# Patient Record
Sex: Female | Born: 1955 | Race: White | Hispanic: No | Marital: Single | Smoking: Never smoker
Health system: Southern US, Community
[De-identification: ages and names within clinical notes are randomized; demographics above are authoritative.]

## PROBLEM LIST (undated history)

## (undated) DIAGNOSIS — R112 Nausea with vomiting, unspecified: Secondary | ICD-10-CM

## (undated) DIAGNOSIS — K219 Gastro-esophageal reflux disease without esophagitis: Secondary | ICD-10-CM

## (undated) DIAGNOSIS — E119 Type 2 diabetes mellitus without complications: Secondary | ICD-10-CM

## (undated) DIAGNOSIS — Z9889 Other specified postprocedural states: Secondary | ICD-10-CM

## (undated) DIAGNOSIS — E781 Pure hyperglyceridemia: Secondary | ICD-10-CM

## (undated) DIAGNOSIS — M1990 Unspecified osteoarthritis, unspecified site: Secondary | ICD-10-CM

## (undated) HISTORY — DX: Pure hyperglyceridemia: E78.1

## (undated) HISTORY — DX: Type 2 diabetes mellitus without complications: E11.9

## (undated) HISTORY — PX: EYE SURGERY: SHX253

## (undated) HISTORY — PX: KNEE ARTHROPLASTY: SHX992

## (undated) HISTORY — PX: TRIGGER FINGER RELEASE: SHX641

## (undated) HISTORY — PX: OTHER SURGICAL HISTORY: SHX169

---

## 2000-01-02 ENCOUNTER — Encounter: Admission: RE | Admit: 2000-01-02 | Discharge: 2000-01-02 | Payer: Self-pay | Admitting: Obstetrics and Gynecology

## 2000-01-02 ENCOUNTER — Encounter: Payer: Self-pay | Admitting: Obstetrics and Gynecology

## 2001-10-27 ENCOUNTER — Ambulatory Visit (HOSPITAL_COMMUNITY): Admission: RE | Admit: 2001-10-27 | Discharge: 2001-10-28 | Payer: Self-pay | Admitting: Ophthalmology

## 2003-09-29 ENCOUNTER — Encounter: Admission: RE | Admit: 2003-09-29 | Discharge: 2003-09-29 | Payer: Self-pay | Admitting: Gynecology

## 2003-10-17 ENCOUNTER — Other Ambulatory Visit: Admission: RE | Admit: 2003-10-17 | Discharge: 2003-10-17 | Payer: Self-pay | Admitting: Gynecology

## 2004-11-14 ENCOUNTER — Other Ambulatory Visit: Admission: RE | Admit: 2004-11-14 | Discharge: 2004-11-14 | Payer: Self-pay | Admitting: Gynecology

## 2004-11-14 ENCOUNTER — Encounter: Admission: RE | Admit: 2004-11-14 | Discharge: 2004-11-14 | Payer: Self-pay | Admitting: Gynecology

## 2006-08-14 ENCOUNTER — Encounter: Admission: RE | Admit: 2006-08-14 | Discharge: 2006-08-14 | Payer: Self-pay | Admitting: Gynecology

## 2006-08-17 ENCOUNTER — Other Ambulatory Visit: Admission: RE | Admit: 2006-08-17 | Discharge: 2006-08-17 | Payer: Self-pay | Admitting: Gynecology

## 2007-08-17 ENCOUNTER — Encounter: Admission: RE | Admit: 2007-08-17 | Discharge: 2007-08-17 | Payer: Self-pay | Admitting: Gynecology

## 2008-09-28 ENCOUNTER — Encounter: Admission: RE | Admit: 2008-09-28 | Discharge: 2008-09-28 | Payer: Self-pay | Admitting: Gynecology

## 2008-11-09 ENCOUNTER — Ambulatory Visit (HOSPITAL_BASED_OUTPATIENT_CLINIC_OR_DEPARTMENT_OTHER): Admission: RE | Admit: 2008-11-09 | Discharge: 2008-11-09 | Payer: Self-pay | Admitting: Orthopedic Surgery

## 2009-11-09 ENCOUNTER — Encounter: Admission: RE | Admit: 2009-11-09 | Discharge: 2009-11-09 | Payer: Self-pay | Admitting: Gynecology

## 2010-09-16 LAB — BASIC METABOLIC PANEL
BUN: 12 mg/dL (ref 6–23)
Calcium: 9.3 mg/dL (ref 8.4–10.5)
Creatinine, Ser: 0.92 mg/dL (ref 0.4–1.2)
GFR calc non Af Amer: >60 mL/min (ref >60)
Glucose, Bld: 131 mg/dL — ABNORMAL HIGH (ref 70–99)
Potassium: 4.3 mEq/L (ref 3.5–5.1)

## 2010-09-16 LAB — POCT HEMOGLOBIN-HEMACUE: Hemoglobin: 14.6 g/dL (ref 12.0–15.0)

## 2010-10-15 ENCOUNTER — Other Ambulatory Visit: Payer: Self-pay | Admitting: Gynecology

## 2010-10-15 DIAGNOSIS — Z1231 Encounter for screening mammogram for malignant neoplasm of breast: Secondary | ICD-10-CM

## 2010-10-22 NOTE — Op Note (Signed)
Maria Malone, Maria Malone NO.:  [id number]   MEDICAL RECORD NO.:  [id number]          PATIENT TYPE:  AMB   LOCATION:  DSC                          FACILITY:  [facility]   PHYSICIAN:  Loreta Ave, M.D. DATE OF BIRTH:  [birth year]   DATE OF PROCEDURE:  11/09/2008  DATE OF DISCHARGE:                               OPERATIVE REPORT   PREOPERATIVE DIAGNOSES:  1. Left shoulder impingement.  2. Distal clavicle osteolysis.  3. Rotator cuff tendinopathy.   POSTOPERATIVE DIAGNOSES:  1. Left shoulder impingement.  2. Distal clavicle osteolysis.  3. Rotator cuff tendinopathy.   Now with a full-thickness tear, supraspinatus tendon in the anterior  aspect as well as secondary adhesive capsulitis.   PROCEDURE:  Left shoulder exam manipulation under anesthesia.  Arthroscopy, debridement rotator cuff.  Bursectomy, acromioplasty, and  CA ligament release.  Excision distal clavicle.  Arthroscopic-assisted  rotator cuff repair, fiber weave suture x1, swivel lock anchor x1.   SURGEON:  Loreta Ave, MD   ASSISTANT:  Genene Churn. Barry Dienes, [place], present throughout the entire case  necessary for timely completion of procedure.   ANESTHESIA:  General.   BLOOD LOSS:  Minimal.   SPECIMENS:  None.   CULTURES:  None.   COMPLICATIONS:  None.   DRESSING:  Soft compressive with shoulder immobilizer.   PROCEDURE:  The patient was brought to the operating room and placed on  the operative table in supine position.  After adequate anesthesia had  been obtained, placed in beach-chair position, prepped and draped in  usual sterile fashion.  Shoulder examined.  Lacking about 20% of motion  especially abduction and forward flexion.  Manipulated break up  adhesions achieving full motion stable shoulder without difficulty.  After being prepped and draped 3 portals created, one each anterior,  posterior, and lateral.  Shoulder entered with blunt obturator.  Arthroscope reduced.  Shoulder  distended and inspected.  Obvious  functional full-thickness tear, crescent region, supraspinatus tendon,  anterior half.  Debrided.  Visualized from below and above confirming  full-thickness tear.  Tuberosity roughened with bleeding bone.  Very  easily reparable, not retracted.  Subscapular infraspinatus, biceps  tendon, biceps anchor, and articular cartilage all otherwise looked  good.  From above almost a type 4 acromion.  Bursa resected.  Acromioplasty to a type 1 acromion releasing CA ligament.  Distal  clavicle grade 4 changes.  Periarticular spurs.  Periarticular spurs  lateral centimeter of clavicle resected with shaver high-speed bur.  Adequacy of decompression clavicle excision confirmed.  Cannula was  placed in the anterior and lateral portal.  The cuff was then captured  with a horizontal mattress suture with a scorpion device and fiber weave  suture.  This was brought out laterally and then firmly anchored down  into a punch hole in the tuberosity with a swivel lock anchor.  Nice  firm watertight closure of the cuff confirmed.  Instruments and fluid  removed.  Portals of shoulder and bursa injected with Marcaine.  Portals  were closed with 4-0 nylon.  A sterile compressive dressing applied.  A  shoulder immobilizer  applied.  Anesthesia reversed.  Brought to the  recovery room.  Tolerated surgery well.  No complications.      Loreta Ave, M.D.  Electronically Signed     DFM/MEDQ  D:  11/09/2008  T:  11/10/2008  Job:  [id number]

## 2010-10-25 NOTE — Op Note (Signed)
[place]. [facility]  Patient:    Maria Malone, Maria Malone Visit Number: [id number] MRN: [id number]          Service Type: DSU Location: [id number] Attending Physician:  Bertrum Sol Dictated by:   Beulah Gandy. Ashley Royalty, M.D. Proc. Date: 10/27/01 Admit Date:  10/27/2001 Discharge Date: 10/28/2001                             Operative Report  DATE OF BIRTH:  Jul 01, 1955  ADMISSION DIAGNOSES: 1. Retinal detachment with breaks, left eye. 2. Large retinal detachment with breaks, right eye.  PROCEDURES: 1. Laser photocoagulation, left eye. 2. Scleral buckle, right eye. 3. Laser photocoagulation, right eye.  SURGEON:  Beulah Gandy. Ashley Royalty, M.D.  ASSISTANT:   Winfred Burn, COA, SA.  ANESTHESIA:  General.  DESCRIPTION OF PROCEDURE:  Before prep and drape, the left eye was inspected and found to have multiple breaks.  These were surrounded with indirect ophthalmoscope laser; 548 burns were placed with a power of 400 mW, 1000 microns each, and 0.1 seconds each around the retinal breaks.  The largest break was at 12 oclock and had a small area of detachment around it. Attention was then carried to the right eye where usual prep and drape was performed.  A 360 limbal peritomy, isolation of four rectus muscles, 2-0 silk. Localization of breaks in the lower temporal quadrant.  Scleral dissection from 3 oclock to 9 oclock to admit a #279 intrascleral implant.  Diathermy placed in the bed.  Additional posterior dissection carried out in the lower temporal quadrant to accommodate a 508G radial piece.  The diathermy was placed in the scleral bed, and sutures were placed in the scleral flaps for a total of 5 scleral sutures.  The 279 implant was placed against the globe.  A 508G radial segment was created.  Perforation site chosen at 8 oclock in the posterior aspect of the bed.  A large amount of clear, colorless subretinal fluid came forth.  The buckle elements  were placed.  Scleral flaps were closed, knotted, and the free ends removed.  Indirect ophthalmoscopy showed the retina to by lying nicely in place on the scleral buckle with the breaks well supported.  The indirect ophthalmoscope laser was moved into place, and 412 burns were placed around the retinal breaks and on the scleral buckle. The power was 400 mW, 1000 microns each, and 0.1 seconds each.  A 240 band was placed around the eye with a 270 sleeve at 2 oclock and a belt loop at 1, 3, and 10 oclock.  The band was adjusted to a proper indentation on the globe and the ends were trimmed.  The closing tension was 15 with a Barraquer tonometer.  The conjunctiva was reposited with a 7-0 chromic suture. Polymyxin and gentamicin were irrigated in the tenon space. atropine solution was applied.  Decadron 10 mg was injected into the lower subconjunctival space.  Marcaine was injected around the globe for postop pain. Polysporin, a patch, and shield were placed.  The patient was awakened and taken to recovery in satisfactory condition.  Closing tension 15 mmHg. Complications: None.  Duration: 1-1/2 hours. Dictated by:   Beulah Gandy. Ashley Royalty, M.D. Attending Physician:  Bertrum Sol DD:  10/27/01 TD:  10/29/01 Job: [id number]

## 2010-11-11 ENCOUNTER — Ambulatory Visit
Admission: RE | Admit: 2010-11-11 | Discharge: 2010-11-11 | Disposition: A | Discharge status: Home / Self Care | Payer: PRIVATE HEALTH INSURANCE | Source: Ambulatory Visit | Attending: Gynecology | Admitting: Gynecology

## 2010-11-11 DIAGNOSIS — Z1231 Encounter for screening mammogram for malignant neoplasm of breast: Secondary | ICD-10-CM

## 2011-06-19 ENCOUNTER — Encounter (INDEPENDENT_AMBULATORY_CARE_PROVIDER_SITE_OTHER): Payer: PRIVATE HEALTH INSURANCE | Admitting: Ophthalmology

## 2011-07-02 ENCOUNTER — Ambulatory Visit (INDEPENDENT_AMBULATORY_CARE_PROVIDER_SITE_OTHER): Payer: BC Managed Care – PPO | Admitting: Ophthalmology

## 2011-07-02 DIAGNOSIS — H43819 Vitreous degeneration, unspecified eye: Secondary | ICD-10-CM

## 2011-07-02 DIAGNOSIS — H33309 Unspecified retinal break, unspecified eye: Secondary | ICD-10-CM

## 2011-07-02 DIAGNOSIS — E1139 Type 2 diabetes mellitus with other diabetic ophthalmic complication: Secondary | ICD-10-CM

## 2011-07-02 DIAGNOSIS — H33009 Unspecified retinal detachment with retinal break, unspecified eye: Secondary | ICD-10-CM

## 2011-07-02 DIAGNOSIS — E11319 Type 2 diabetes mellitus with unspecified diabetic retinopathy without macular edema: Secondary | ICD-10-CM

## 2011-07-02 DIAGNOSIS — H35419 Lattice degeneration of retina, unspecified eye: Secondary | ICD-10-CM

## 2011-09-12 ENCOUNTER — Other Ambulatory Visit: Payer: Self-pay | Admitting: Gynecology

## 2011-09-12 DIAGNOSIS — Z1231 Encounter for screening mammogram for malignant neoplasm of breast: Secondary | ICD-10-CM

## 2011-11-25 ENCOUNTER — Ambulatory Visit
Admission: RE | Admit: 2011-11-25 | Discharge: 2011-11-25 | Disposition: A | Discharge status: Home / Self Care | Payer: BC Managed Care – PPO | Source: Ambulatory Visit | Attending: Gynecology | Admitting: Gynecology

## 2011-11-25 DIAGNOSIS — Z1231 Encounter for screening mammogram for malignant neoplasm of breast: Secondary | ICD-10-CM

## 2011-12-30 ENCOUNTER — Other Ambulatory Visit: Payer: Self-pay | Admitting: Otolaryngology

## 2011-12-30 DIAGNOSIS — R220 Localized swelling, mass and lump, head: Secondary | ICD-10-CM

## 2011-12-30 DIAGNOSIS — R221 Localized swelling, mass and lump, neck: Secondary | ICD-10-CM

## 2011-12-31 ENCOUNTER — Ambulatory Visit
Admission: RE | Admit: 2011-12-31 | Discharge: 2011-12-31 | Disposition: A | Discharge status: Home / Self Care | Payer: BC Managed Care – PPO | Source: Ambulatory Visit | Attending: Otolaryngology | Admitting: Otolaryngology

## 2011-12-31 DIAGNOSIS — R220 Localized swelling, mass and lump, head: Secondary | ICD-10-CM

## 2011-12-31 MED ORDER — IOHEXOL 300 MG/ML  SOLN
75.0000 mL | Freq: Once | INTRAMUSCULAR | Status: AC | PRN
  Administered 2011-12-31: 75 mL via INTRAVENOUS

## 2012-01-12 ENCOUNTER — Other Ambulatory Visit: Payer: Self-pay | Admitting: Otolaryngology

## 2012-02-17 ENCOUNTER — Ambulatory Visit (INDEPENDENT_AMBULATORY_CARE_PROVIDER_SITE_OTHER): Payer: BC Managed Care – PPO | Admitting: Emergency Medicine

## 2012-02-17 VITALS — BP 117/73 | HR 82 | Temp 98.3°F | Resp 16

## 2012-02-17 DIAGNOSIS — L508 Other urticaria: Secondary | ICD-10-CM

## 2012-02-17 MED ORDER — DIPHENHYDRAMINE HCL 25 MG PO CAPS
50.0000 mg | ORAL_CAPSULE | Freq: Once | ORAL | Status: AC
  Administered 2012-02-17: 50 mg via ORAL

## 2012-02-17 MED ORDER — METHYLPREDNISOLONE ACETATE 80 MG/ML IJ SUSP
120.0000 mg | Freq: Once | INTRAMUSCULAR | Status: AC
  Administered 2012-02-17: 120 mg via INTRAMUSCULAR

## 2012-02-17 NOTE — Progress Notes (Addendum)
   Date:  02/17/2012   Name:  Maria Malone   DOB:  May 07, 1956   MRN:  [id number] Gender: female Age: 56 y.o.  PCP:  No primary provider on file.    Chief Complaint: Allergic Reaction   History of Present Illness:  Maria Malone is a 56 y.o. pleasant patient who presents with the following:  Took ibuprofen at 2:30-3:00 and developed urticaria and swelling of the face.  No hoarseness, difficulty swallowing or talking, no wheezing or shortness of breath.  Has been under a dermatologist's care since the spring of 13 for evaluation and treatment of hives.  Takes allegra (unkown dose) twice daily for treatment of her urticaria.  She has no new allergen exposure and no other complaints.    There is no problem list on file for this patient.   No past medical history on file.  No past surgical history on file.  History  Substance Use Topics  . Smoking status: Not on file  . Smokeless tobacco: Not on file  . Alcohol Use: Not on file    No family history on file.  No Known Allergies  Medication list has been reviewed and updated.  Current Outpatient Prescriptions on File Prior to Visit  Medication Sig Dispense Refill  . metFORMIN (GLUCOPHAGE) 1000 MG tablet Take 1,000 mg by mouth 2 (two) times daily with a meal.      . omeprazole (PRILOSEC) 20 MG capsule Take 20 mg by mouth 2 (two) times daily.      . rosuvastatin (CRESTOR) 10 MG tablet Take 10 mg by mouth daily.        Review of Systems:  As per HPI, otherwise negative.    Physical Examination: Filed Vitals:   02/17/12 1919  BP: 117/73  Pulse: 82  Temp: 98.3 F (36.8 C)  Resp: 16   There were no vitals filed for this visit. There is no height or weight on file to calculate BMI. Ideal Body Weight:    GEN: WDWN, NAD, Non-toxic, A & O x 3  Urticaria on left upper arm.   HEENT: Atraumatic, Normocephalic. Neck supple. No masses, No LAD.  Swelling upper lip and cheeks.  Oropharynx negative Ears and Nose: No external  deformity.  TM negative CV: RRR, No M/G/R. No JVD. No thrill. No extra heart sounds. PULM: CTA B, no wheezes, crackles, rhonchi. No retractions. No resp. distress. No accessory muscle use. ABD: S, NT, ND, +BS. No rebound. No HSM. EXTR: No c/c/e NEURO Normal gait.  PSYCH: Normally interactive. Conversant. Not depressed or anxious appearing.  Calm demeanor.    Assessment and Plan: Allergic reaction presumably to ibuprofen Depo medrol Benadryl Avoid NSAIDs in future Follow up as needed  Carmelina Dane, MD I have reviewed and agree with documentation. Robert P. Merla Riches, M.D.

## 2012-02-17 NOTE — Patient Instructions (Addendum)
Hives Hives (urticaria) are itchy, red, swollen patches on the skin. They may change size, shape, and location quickly and repeatedly. Hives that occur deeper in the skin can cause swelling of the hands, feet, and face. Hives may be an allergic reaction to something you or your child ate, touched, or put on the skin. Hives can also be a reaction to cold, heat, viral infections, medication, insect bites, or emotional stress. Often the cause is hard to find. Hives can come and go for several days to several weeks. Hives are not contagious. HOME CARE INSTRUCTIONS   If the cause of the hives is known, avoid exposure to that source.   To relieve itching and rash:   Apply cold compresses to the skin or take cool water baths. Do not take or give your child hot baths or showers because the warmth will make the itching worse.   The best medicine for hives is an antihistamine. An antihistamine will not cure hives, but it will reduce their severity. You can use an antihistamine available over the counter. This medicine may make your child sleepy. Teenagers should not drive while using this medicine.   Take or give an antihistamine every 6 hours until the hives are completely gone for 24 hours or as directed.   Your child may have other medications prescribed for itching. Give these as directed by your child's caregiver.   You or your child should wear loose fitting clothing, including undergarments. Skin irritations may make hives worse.   Follow-up as directed by your caregiver.  SEEK MEDICAL CARE IF:   You or your child still have considerable itching after taking the medication (prescribed or purchased over the counter).   Joint swelling or pain occurs.  SEEK IMMEDIATE MEDICAL CARE IF:   You have a fever.   Swollen lips or tongue are noticed.   There is difficulty with breathing, swallowing, or tightness in the throat or chest.   Abdominal pain develops.   Your child starts acting very  sick.  These may be the first signs of a life-threatening allergic reaction. THIS IS AN EMERGENCY. Call 911 for medical help. MAKE SURE YOU:   Understand these instructions.   Will watch your condition.   Will get help right away if you are not doing well or get worse.  Document Released: 05/26/2005 Document Revised: 05/15/2011 Document Reviewed: 01/14/2008 Exit[employer] Patient Information 2012 [employer]. 

## 2012-07-02 ENCOUNTER — Ambulatory Visit (INDEPENDENT_AMBULATORY_CARE_PROVIDER_SITE_OTHER): Payer: BC Managed Care – PPO | Admitting: Ophthalmology

## 2012-07-02 DIAGNOSIS — E11319 Type 2 diabetes mellitus with unspecified diabetic retinopathy without macular edema: Secondary | ICD-10-CM

## 2012-07-02 DIAGNOSIS — H2510 Age-related nuclear cataract, unspecified eye: Secondary | ICD-10-CM

## 2012-07-02 DIAGNOSIS — H33009 Unspecified retinal detachment with retinal break, unspecified eye: Secondary | ICD-10-CM

## 2012-07-02 DIAGNOSIS — H33309 Unspecified retinal break, unspecified eye: Secondary | ICD-10-CM

## 2012-07-02 DIAGNOSIS — E1139 Type 2 diabetes mellitus with other diabetic ophthalmic complication: Secondary | ICD-10-CM

## 2012-07-02 DIAGNOSIS — E1165 Type 2 diabetes mellitus with hyperglycemia: Secondary | ICD-10-CM

## 2012-07-02 DIAGNOSIS — H43819 Vitreous degeneration, unspecified eye: Secondary | ICD-10-CM

## 2012-10-21 ENCOUNTER — Other Ambulatory Visit: Payer: Self-pay

## 2012-10-21 DIAGNOSIS — Z1231 Encounter for screening mammogram for malignant neoplasm of breast: Secondary | ICD-10-CM

## 2012-11-30 ENCOUNTER — Ambulatory Visit
Admission: RE | Admit: 2012-11-30 | Discharge: 2012-11-30 | Disposition: A | Discharge status: Home / Self Care | Payer: BC Managed Care – PPO | Source: Ambulatory Visit

## 2012-11-30 DIAGNOSIS — Z1231 Encounter for screening mammogram for malignant neoplasm of breast: Secondary | ICD-10-CM

## 2013-01-17 ENCOUNTER — Encounter: Payer: Self-pay | Admitting: Gynecology

## 2013-01-17 ENCOUNTER — Other Ambulatory Visit (HOSPITAL_COMMUNITY)
Admission: RE | Admit: 2013-01-17 | Discharge: 2013-01-17 | Disposition: A | Discharge status: Home / Self Care | Payer: BC Managed Care – PPO | Source: Ambulatory Visit | Attending: Gynecology | Admitting: Gynecology

## 2013-01-17 ENCOUNTER — Ambulatory Visit (INDEPENDENT_AMBULATORY_CARE_PROVIDER_SITE_OTHER): Payer: BC Managed Care – PPO | Admitting: Gynecology

## 2013-01-17 VITALS — BP 112/72 | Ht 65.5 in | Wt 181.0 lb

## 2013-01-17 DIAGNOSIS — Z1151 Encounter for screening for human papillomavirus (HPV): Secondary | ICD-10-CM | POA: Insufficient documentation

## 2013-01-17 DIAGNOSIS — Z79890 Hormone replacement therapy (postmenopausal): Secondary | ICD-10-CM

## 2013-01-17 DIAGNOSIS — N952 Postmenopausal atrophic vaginitis: Secondary | ICD-10-CM

## 2013-01-17 DIAGNOSIS — E119 Type 2 diabetes mellitus without complications: Secondary | ICD-10-CM | POA: Insufficient documentation

## 2013-01-17 DIAGNOSIS — E785 Hyperlipidemia, unspecified: Secondary | ICD-10-CM | POA: Insufficient documentation

## 2013-01-17 DIAGNOSIS — Z01419 Encounter for gynecological examination (general) (routine) without abnormal findings: Secondary | ICD-10-CM

## 2013-01-17 DIAGNOSIS — Z8639 Personal history of other endocrine, nutritional and metabolic disease: Secondary | ICD-10-CM

## 2013-01-17 MED ORDER — ESTRADIOL 0.1 MG/GM VA CREA: TOPICAL_CREAM | VAGINAL | Status: DC

## 2013-01-17 NOTE — Progress Notes (Signed)
Maria Malone [birth year] [id number]   History:    57 y.o.  for annual gyn exam who is a new patient to the practice. The patient without any complaints. Patient was previously been followed by Dr. Nicholas Lose who has retired and we do not have her past records to review. Patient is being followed by Dr. Casimiro Needle Altheimer for her type 2 diabetes and hyperlipidemia and has been doing her lab work. Patient denies any past history of abnormal Pap smears. Her mammogram June of this year was normal. Dr. Susy Frizzle off had done her colonoscopy in 2012. Patient's father with history of colon polyp and for this reason patient is on a five-year recall. Patient's sister had breast cancer at the age of 68 and patient not sure if she was tested for BRCA1 and BRCA2 gene mutation. Patient's only hormone replacement therapy has been Premarin vaginal cream twice a week for vaginal atrophy. Patient stated over 2 years ago she had a normal bone density study. She has had history of vitamin D deficiency in the past. She is currently taking calcium and vitamin D a regular basis and is active exercising regularly. She is a Estate agent.  Past medical history,surgical history, family history and social history were all reviewed and documented in the EPIC chart.  Gynecologic History No LMP recorded. Patient is postmenopausal. Contraception: post menopausal status Last Pap: 2013. Results were: normal Last mammogram: 2014. Results were: normal  Obstetric History OB History   Grav Para Term Preterm Abortions TAB SAB Ect Mult Living   0                ROS: A ROS was performed and pertinent positives and negatives are included in the history.  GENERAL: No fevers or chills. HEENT: No change in vision, no earache, sore throat or sinus congestion. NECK: No pain or stiffness. CARDIOVASCULAR: No chest pain or pressure. No palpitations. PULMONARY: No shortness of breath, cough or wheeze. GASTROINTESTINAL: No abdominal  pain, nausea, vomiting or diarrhea, melena or bright red blood per rectum. GENITOURINARY: No urinary frequency, urgency, hesitancy or dysuria. MUSCULOSKELETAL: No joint or muscle pain, no back pain, no recent trauma. DERMATOLOGIC: No rash, no itching, no lesions. ENDOCRINE: No polyuria, polydipsia, no heat or cold intolerance. No recent change in weight. HEMATOLOGICAL: No anemia or easy bruising or bleeding. NEUROLOGIC: No headache, seizures, numbness, tingling or weakness. PSYCHIATRIC: No depression, no loss of interest in normal activity or change in sleep pattern.     Exam: chaperone present  BP 112/72  Ht 5' 5.5" (1.664 m)  Wt 181 lb (82.101 kg)  BMI 29.65 kg/m2  Body mass index is 29.65 kg/(m^2).  General appearance : Well developed well nourished female. No acute distress HEENT: Neck supple, trachea midline, no carotid bruits, no thyroidmegaly Lungs: Clear to auscultation, no rhonchi or wheezes, or rib retractions  Heart: Regular rate and rhythm, no murmurs or gallops Breast:Examined in sitting and supine position were symmetrical in appearance, no palpable masses or tenderness,  no skin retraction, no nipple inversion, no nipple discharge, no skin discoloration, no axillary or supraclavicular lymphadenopathy Abdomen: no palpable masses or tenderness, no rebound or guarding Extremities: no edema or skin discoloration or tenderness  Pelvic:  Bartholin, Urethra, Skene Glands: Within normal limits             Vagina: No gross lesions or discharge  Cervix: No gross lesions or discharge  Uterus  anteverted, normal size, shape and consistency, non-tender and mobile  Adnexa  Without masses or tenderness  Anus and perineum  normal   Rectovaginal  normal sphincter tone without palpated masses or tenderness             Hemoccult card provided     Assessment/Plan:  57 y.o. female for annual exam with history of type 2 diabetes and hyperlipidemia being followed by her endocrinologist. No  lab work done today. Pap smear was done today. The new guidelines were discussed. Patient was reminded to do monthly self breast examination. She will schedule a bone density study here in the office in a few weeks. She was provided with Hemoccult card to submit to the office for testing. We once again requests records from Dr. Nicholas Lose.    Ok Edwards MD, 11:35 AM 01/17/2013

## 2013-01-17 NOTE — Addendum Note (Signed)
Addended by: Richardson Chiquito on: 01/17/2013 11:49 AM   Modules accepted: Orders

## 2013-01-17 NOTE — Patient Instructions (Addendum)
Shingles Vaccine What You Need to Know WHAT IS SHINGLES?  Shingles is a painful skin rash, often with blisters. It is also called Herpes Zoster or just Zoster.  A shingles rash usually appears on one side of the face or body and lasts from 2 to 4 weeks. Its main symptom is pain, which can be quite severe. Other symptoms of shingles can include fever, headache, chills, and upset stomach. Very rarely, a shingles infection can lead to pneumonia, hearing problems, blindness, brain inflammation (encephalitis), or death.  For about 1 person in 5, severe pain can continue even after the rash clears up. This is called post-herpetic neuralgia.  Shingles is caused by the Varicella Zoster virus. This is the same virus that causes chickenpox. Only someone who has had a case of chickenpox or rarely, has gotten chickenpox vaccine, can get shingles. The virus stays in your body. It can reappear many years later to cause a case of shingles.  You cannot catch shingles from another person with shingles. However, a person who has never had chickenpox (or chickenpox vaccine) could get chickenpox from someone with shingles. This is not very common.  Shingles is far more common in people 50 and older than in younger people. It is also more common in people whose immune systems are weakened because of a disease such as cancer or drugs such as steroids or chemotherapy.  At least 1 million people get shingles per year in the [place]. SHINGLES VACCINE  A vaccine for shingles was licensed in 2006. In clinical trials, the vaccine reduced the risk of shingles by 50%. It can also reduce the pain in people who still get shingles after being vaccinated.  A single dose of shingles vaccine is recommended for adults 60 years of age and older. SOME PEOPLE SHOULD NOT GET SHINGLES VACCINE OR SHOULD WAIT A person should not get shingles vaccine if he or she:  Has ever had a life-threatening allergic reaction to gelatin, the  antibiotic neomycin, or any other component of shingles vaccine. Tell your caregiver if you have any severe allergies.  Has a weakened immune system because of current:  AIDS or another disease that affects the immune system.  Treatment with drugs that affect the immune system, such as prolonged use of high-dose steroids.  Cancer treatment, such as radiation or chemotherapy.  Cancer affecting the bone marrow or lymphatic system, such as leukemia or lymphoma.  Is pregnant, or might be pregnant. Women should not become pregnant until at least 4 weeks after getting shingles vaccine. Someone with a minor illness, such as a cold, may be vaccinated. Anyone with a moderate or severe acute illness should usually wait until he or she recovers before getting the vaccine. This includes anyone with a temperature of 101.3 F (38 C) or higher. WHAT ARE THE RISKS FROM SHINGLES VACCINE?  A vaccine, like any medicine, could possibly cause serious problems, such as severe allergic reactions. However, the risk of a vaccine causing serious harm, or death, is extremely small.  No serious problems have been identified with shingles vaccine. Mild Problems  Redness, soreness, swelling, or itching at the site of the injection (about 1 person in 3).  Headache (about 1 person in 70). Like all vaccines, shingles vaccine is being closely monitored for unusual or severe problems. WHAT IF THERE IS A MODERATE OR SEVERE REACTION? What should I look for? Any unusual condition, such as a severe allergic reaction or a high fever. If a severe allergic reaction   occurred, it would be within a few minutes to an hour after the shot. Signs of a serious allergic reaction can include difficulty breathing, weakness, hoarseness or wheezing, a fast heartbeat, hives, dizziness, paleness, or swelling of the throat. What should I do?  Call your caregiver, or get the person to a caregiver right away.  Tell the caregiver what  happened, the date and time it happened, and when the vaccination was given.  Ask the caregiver to report the reaction by filing a Vaccine Adverse Event Reporting System (VAERS) form. Or, you can file this report through the VAERS web site at www.vaers.LAgents.no or by calling 1-985-232-5186. VAERS does not provide medical advice. HOW CAN I LEARN MORE?  Ask your caregiver. He or she can give you the vaccine package insert or suggest other sources of information.  Contact the Centers for Disease Control and Prevention (CDC):  Call 614-087-8413 (1-800-CDC-INFO).  Visit the CDC website at PicCapture.uy CDC Shingles Vaccine VIS (03/14/08) Document Released: 03/23/2006 Document Revised: 08/18/2011 Document Reviewed: 03/14/2008 ExitCare Patient Information 2014 [place], [place]. Tetanus, Diphtheria, Pertussis (Tdap) Vaccine What You Need to Know WHY GET VACCINATED? Tetanus, diphtheria and pertussis can be very serious diseases, even for adolescents and adults. Tdap vaccine can protect [place] from these diseases. TETANUS (Lockjaw) causes painful muscle tightening and stiffness, usually all over the body.  It can lead to tightening of muscles in the head and neck so you can't open your mouth, swallow, or sometimes even breathe. Tetanus kills about 1 out of 5 people who are infected. DIPHTHERIA can cause a thick coating to form in the back of the throat.  It can lead to breathing problems, paralysis, heart failure, and death. PERTUSSIS (Whooping Cough) causes severe coughing spells, which can cause difficulty breathing, vomiting and disturbed sleep.  It can also lead to weight loss, incontinence, and rib fractures. Up to 2 in 100 adolescents and 5 in 100 adults with pertussis are hospitalized or have complications, which could include pneumonia and death. These diseases are caused by bacteria. Diphtheria and pertussis are spread from person to person through coughing or sneezing. Tetanus enters  the body through cuts, scratches, or wounds. Before vaccines, the [place] saw as many as 200,000 cases a year of diphtheria and pertussis, and hundreds of cases of tetanus. Since vaccination began, tetanus and diphtheria have dropped by about 99% and pertussis by about 80%. TDAP VACCINE Tdap vaccine can protect adolescents and adults from tetanus, diphtheria, and pertussis. One dose of Tdap is routinely given at age 96 or 62. People who did not get Tdap at that age should get it as soon as possible. Tdap is especially important for health care professionals and anyone having close contact with a baby younger than 12 months. Pregnant women should get a dose of Tdap during every pregnancy, to protect the newborn from pertussis. Infants are most at risk for severe, life-threatening complications from pertussis. A similar vaccine, called Td, protects from tetanus and diphtheria, but not pertussis. A Td booster should be given every 10 years. Tdap may be given as one of these boosters if you have not already gotten a dose. Tdap may also be given after a severe cut or burn to prevent tetanus infection. Your doctor can give you more information. Tdap may safely be given at the same time as other vaccines. SOME PEOPLE SHOULD NOT GET THIS VACCINE  If you ever had a life-threatening allergic reaction after a dose of any tetanus, diphtheria, or pertussis containing  vaccine, OR if you have a severe allergy to any part of this vaccine, you should not get Tdap. Tell your doctor if you have any severe allergies.  If you had a coma, or long or multiple seizures within 7 days after a childhood dose of DTP or DTaP, you should not get Tdap, unless a cause other than the vaccine was found. You can still get Td.  Talk to your doctor if you:  have epilepsy or another nervous system problem,  had severe pain or swelling after any vaccine containing diphtheria, tetanus or pertussis,  ever had Guillain-Barr  Syndrome (GBS),  aren't feeling well on the day the shot is scheduled. RISKS OF A VACCINE REACTION With any medicine, including vaccines, there is a chance of side effects. These are usually mild and go away on their own, but serious reactions are also possible. Brief fainting spells can follow a vaccination, leading to injuries from falling. Sitting or lying down for about 15 minutes can help prevent these. Tell your doctor if you feel dizzy or light-headed, or have vision changes or ringing in the ears. Mild problems following Tdap (Did not interfere with activities)  Pain where the shot was given (about 3 in 4 adolescents or 2 in 3 adults)  Redness or swelling where the shot was given (about 1 person in 5)  Mild fever of at least 100.68F (up to about 1 in 25 adolescents or 1 in 100 adults)  Headache (about 3 or 4 people in 10)  Tiredness (about 1 person in 3 or 4)  Nausea, vomiting, diarrhea, stomach ache (up to 1 in 4 adolescents or 1 in 10 adults)  Chills, body aches, sore joints, rash, swollen glands (uncommon) Moderate problems following Tdap (Interfered with activities, but did not require medical attention)  Pain where the shot was given (about 1 in 5 adolescents or 1 in 100 adults)  Redness or swelling where the shot was given (up to about 1 in 16 adolescents or 1 in 25 adults)  Fever over 102F (about 1 in 100 adolescents or 1 in 250 adults)  Headache (about 3 in 20 adolescents or 1 in 10 adults)  Nausea, vomiting, diarrhea, stomach ache (up to 1 or 3 people in 100)  Swelling of the entire arm where the shot was given (up to about 3 in 100). Severe problems following Tdap (Unable to perform usual activities, required medical attention)  Swelling, severe pain, bleeding and redness in the arm where the shot was given (rare). A severe allergic reaction could occur after any vaccine (estimated less than 1 in a million doses). WHAT IF THERE IS A SERIOUS REACTION? What  should I look for?  Look for anything that concerns you, such as signs of a severe allergic reaction, very high fever, or behavior changes. Signs of a severe allergic reaction can include hives, swelling of the face and throat, difficulty breathing, a fast heartbeat, dizziness, and weakness. These would start a few minutes to a few hours after the vaccination. What should I do?  If you think it is a severe allergic reaction or other emergency that can't wait, call 9-1-1 or get the person to the nearest hospital. Otherwise, call your doctor.  Afterward, the reaction should be reported to the "Vaccine Adverse Event Reporting System" (VAERS). Your doctor might file this report, or you can do it yourself through the VAERS web site at www.vaers.SamedayNews.es, or by calling 913-238-6089. VAERS is only for reporting reactions. They do not give  medical advice.  THE NATIONAL VACCINE INJURY COMPENSATION PROGRAM The National Vaccine Injury Compensation Program (VICP) is a federal program that was created to compensate people who may have been injured by certain vaccines. Persons who believe they may have been injured by a vaccine can learn about the program and about filing a claim by calling 1-623-589-0911 or visiting the VICP website at SpiritualWord.at. HOW CAN I LEARN MORE?  Ask your doctor.  Call your local or state health department.  Contact the Centers for Disease Control and Prevention (CDC):  Call 579-114-1099 or visit CDC's website at PicCapture.uy. CDC Tdap Vaccine VIS (10/16/11) Document Released: 11/25/2011 Document Revised: 02/18/2012 Document Reviewed: 11/25/2011 ExitCare Patient Information 2014 [place], [place]. BRCA-1 and BRCA-2 BRCA-1 and BRCA-2 are 2 genes that are linked with hereditary breast and ovarian cancers. About 200,000 women are diagnosed with invasive breast cancer each year and about 23,000 with ovarian cancer (according to the American Cancer  Society). Of these cancers, about 5% to 10% will be due to a mutation in one of the BRCA genes. Men can also inherit an increased risk of developing breast cancer, primarily from an alteration in the BRCA-2 gene.  Individuals with mutations in BRCA1 or BRCA2 have significantly elevated risks for breast cancer (up to 80% lifetime risk), ovarian cancer (up to 40% lifetime risk), bilateral breast cancer and other types of cancers. BRCA mutations are inherited and passed from generation to generation. One half of the time, they are passed from the father's side of the family.  The DNA in white blood cells is used to detect mutations in the BRCA genes. While the gene products (proteins) of the BRCA genes act only in breast and ovarian tissue, the genes are present in every cell of the body and blood is the most easily accessible source of that DNA. PREPARATION FOR TEST The test for BRCA mutations is done on a blood sample collected by needle from a vein in the arm. The test does not require surgical biopsy of breast or ovarian tissue.  NORMAL FINDINGS No genetic mutations. Ranges for normal findings may vary among different laboratories and hospitals. You should always check with your doctor after having lab work or other tests done to discuss the meaning of your test results and whether your values are considered within normal limits. MEANING OF TEST  Your caregiver will go over the test results with you and discuss the importance and meaning of your results, as well as treatment options and the need for additional tests if necessary. OBTAINING THE TEST RESULTS It is your responsibility to obtain your test results. Ask the lab or department performing the test when and how you will get your results. OTHER THINGS TO KNOW Your test results may have implications for other family members. When one member of a family is tested for BRCA mutations, issues often arise about how or whether to share this information  with other family members. Seek advice from a genetic counselor about communication of result with your family members.  Pre and post test consultation with a health care provider knowledgeable about genetic testing cannot be overemphasized.  There are many issues to be considered when preparing for a genetic test and upon learning the results, and a genetic counselor has the knowledge and experience to help you sort through them.  If the BRCA test is positive, the options include increased frequency of check-ups (e.g., mammography, blood tests for CA-125, or transvaginal ultrasonography); medications that could reduce risk (e.g., oral  contraceptives or tamoxifen); or surgical removal of the ovaries or breasts. There are a number of variables involved and it is important to discuss your options with your doctor and genetic counselor. Research studies have reported that for every 1000 women negative for BRCA mutations, between 12 and 45 of them will develop breast cancer by age 57 and between 3 and 4 will develop ovarian cancer by age 69. The risk increases with age. The test can be ordered by a doctor, preferably by one who can also offer genetic counseling. The blood sample will be sent to a laboratory that specializes in BRCA testing. The American Society of Clinical Oncology and the National Breast Cancer Coalition encourage women seeking the test to participate in long-term outcome studies to help gather information on the effectiveness of different check-up and treatment options. Document Released: 06/19/2004 Document Revised: 08/18/2011 Document Reviewed: 05/01/2008 [facility] Patient Information 2014 [place], [place].

## 2013-02-01 ENCOUNTER — Encounter: Payer: Self-pay | Admitting: Gynecology

## 2013-03-10 ENCOUNTER — Encounter: Payer: Self-pay | Admitting: Gynecology

## 2013-03-10 ENCOUNTER — Other Ambulatory Visit: Payer: Self-pay | Admitting: Gynecology

## 2013-03-10 ENCOUNTER — Ambulatory Visit (INDEPENDENT_AMBULATORY_CARE_PROVIDER_SITE_OTHER): Payer: BC Managed Care – PPO | Admitting: Anesthesiology

## 2013-03-10 ENCOUNTER — Ambulatory Visit (INDEPENDENT_AMBULATORY_CARE_PROVIDER_SITE_OTHER): Payer: BC Managed Care – PPO

## 2013-03-10 DIAGNOSIS — Z13820 Encounter for screening for osteoporosis: Secondary | ICD-10-CM

## 2013-03-10 DIAGNOSIS — Z23 Encounter for immunization: Secondary | ICD-10-CM

## 2013-03-10 DIAGNOSIS — Z79890 Hormone replacement therapy (postmenopausal): Secondary | ICD-10-CM

## 2013-07-08 ENCOUNTER — Ambulatory Visit (INDEPENDENT_AMBULATORY_CARE_PROVIDER_SITE_OTHER): Payer: BC Managed Care – PPO | Admitting: Ophthalmology

## 2013-07-08 DIAGNOSIS — H33009 Unspecified retinal detachment with retinal break, unspecified eye: Secondary | ICD-10-CM

## 2013-07-08 DIAGNOSIS — E11319 Type 2 diabetes mellitus with unspecified diabetic retinopathy without macular edema: Secondary | ICD-10-CM

## 2013-07-08 DIAGNOSIS — E1139 Type 2 diabetes mellitus with other diabetic ophthalmic complication: Secondary | ICD-10-CM

## 2013-07-08 DIAGNOSIS — H2510 Age-related nuclear cataract, unspecified eye: Secondary | ICD-10-CM

## 2013-07-08 DIAGNOSIS — E1165 Type 2 diabetes mellitus with hyperglycemia: Secondary | ICD-10-CM

## 2013-07-08 DIAGNOSIS — H43819 Vitreous degeneration, unspecified eye: Secondary | ICD-10-CM

## 2013-07-08 DIAGNOSIS — H33309 Unspecified retinal break, unspecified eye: Secondary | ICD-10-CM

## 2013-11-14 ENCOUNTER — Other Ambulatory Visit: Payer: Self-pay

## 2013-11-14 DIAGNOSIS — Z1231 Encounter for screening mammogram for malignant neoplasm of breast: Secondary | ICD-10-CM

## 2013-12-01 ENCOUNTER — Ambulatory Visit
Admission: RE | Admit: 2013-12-01 | Discharge: 2013-12-01 | Disposition: A | Discharge status: Home / Self Care | Payer: BC Managed Care – PPO | Source: Ambulatory Visit

## 2013-12-01 DIAGNOSIS — Z1231 Encounter for screening mammogram for malignant neoplasm of breast: Secondary | ICD-10-CM

## 2013-12-20 ENCOUNTER — Encounter: Payer: BC Managed Care – PPO | Admitting: Gynecology

## 2014-01-18 ENCOUNTER — Encounter: Payer: BC Managed Care – PPO | Admitting: Gynecology

## 2014-02-12 ENCOUNTER — Other Ambulatory Visit: Payer: Self-pay | Admitting: Gynecology

## 2014-02-15 ENCOUNTER — Ambulatory Visit (INDEPENDENT_AMBULATORY_CARE_PROVIDER_SITE_OTHER): Payer: BC Managed Care – PPO | Admitting: Gynecology

## 2014-02-15 ENCOUNTER — Encounter: Payer: Self-pay | Admitting: Gynecology

## 2014-02-15 VITALS — BP 120/74 | Ht 65.5 in | Wt 187.0 lb

## 2014-02-15 DIAGNOSIS — Z01419 Encounter for gynecological examination (general) (routine) without abnormal findings: Secondary | ICD-10-CM

## 2014-02-15 DIAGNOSIS — N952 Postmenopausal atrophic vaginitis: Secondary | ICD-10-CM

## 2014-02-15 DIAGNOSIS — Z79890 Hormone replacement therapy (postmenopausal): Secondary | ICD-10-CM

## 2014-02-15 MED ORDER — ESTRADIOL 0.1 MG/GM VA CREA: TOPICAL_CREAM | VAGINAL | Status: DC

## 2014-02-15 NOTE — Patient Instructions (Signed)

## 2014-02-15 NOTE — Progress Notes (Signed)
Maria Malone 10/21/1955 [id number]   History:    59 y.o.  for annual gyn exam with no complaints today. Patient was seen for the first time as a new patient to the practice in 2014. She was a prior patient of Dr. Ubaldo Glassing who had retired.Patient is being followed by Dr. Legrand Como Altheimer for her type 2 diabetes and hyperlipidemia and has been doing her lab work. Patient denies any past history of abnormal Pap smears. Her mammogram June of this year was normal. Dr. Catalina Antigua off had done her colonoscopy in 2012. Patient's father with history of colon polyp and for this reason patient is on a five-year recall. Patient's sister had breast cancer at the age of 50  both patient and her sister were tested for the [facility] and BRCA2 gene mutation and were negative.Patient's only hormone replacement therapy has been Premarin vaginal cream twice a week for vaginal atrophy. Patient had a normal bone density study in 2014.She has had history of vitamin D deficiency in the past. She is currently taking calcium and vitamin D a regular basis and is active exercising regularly. She is a [employer].     Past medical history,surgical history, family history and social history were all reviewed and documented in the EPIC chart.  Gynecologic History No LMP recorded. Patient is postmenopausal. Contraception: post menopausal status Last Pap: 2014. Results were: normal Last mammogram: 2015. Results were: normal  Obstetric History OB History  Gravida Para Term Preterm AB SAB TAB Ectopic Multiple Living  0                  ROS: A ROS was performed and pertinent positives and negatives are included in the history.  GENERAL: No fevers or chills. HEENT: No change in vision, no earache, sore throat or sinus congestion. NECK: No pain or stiffness. CARDIOVASCULAR: No chest pain or pressure. No palpitations. PULMONARY: No shortness of breath, cough or wheeze. GASTROINTESTINAL: No abdominal pain, nausea,  vomiting or diarrhea, melena or bright red blood per rectum. GENITOURINARY: No urinary frequency, urgency, hesitancy or dysuria. MUSCULOSKELETAL: No joint or muscle pain, no back pain, no recent trauma. DERMATOLOGIC: No rash, no itching, no lesions. ENDOCRINE: No polyuria, polydipsia, no heat or cold intolerance. No recent change in weight. HEMATOLOGICAL: No anemia or easy bruising or bleeding. NEUROLOGIC: No headache, seizures, numbness, tingling or weakness. PSYCHIATRIC: No depression, no loss of interest in normal activity or change in sleep pattern.     Exam: chaperone present  BP 120/74  Ht 5' 5.5" (1.664 m)  Wt 187 lb (84.823 kg)  BMI 30.63 kg/m2  Body mass index is 30.63 kg/(m^2).  General appearance : Well developed well nourished female. No acute distress HEENT: Neck supple, trachea midline, no carotid bruits, no thyroidmegaly Lungs: Clear to auscultation, no rhonchi or wheezes, or rib retractions  Heart: Regular rate and rhythm, no murmurs or gallops Breast:Examined in sitting and supine position were symmetrical in appearance, no palpable masses or tenderness,  no skin retraction, no nipple inversion, no nipple discharge, no skin discoloration, no axillary or supraclavicular lymphadenopathy Abdomen: no palpable masses or tenderness, no rebound or guarding Extremities: no edema or skin discoloration or tenderness  Pelvic:  Bartholin, Urethra, Skene Glands: Within normal limits             Vagina: No gross lesions or discharge  Cervix: No gross lesions or discharge  Uterus  anteverted, normal size, shape and consistency, non-tender and mobile  Adnexa  Without masses or tenderness  Anus and perineum  normal   Rectovaginal  normal sphincter tone without palpated masses or tenderness             Hemoccult PCP provides   Patient offered flu vaccine will receive through her PCP.   Patient reports no vaginal bleeding   Assessment/Plan:  58 y.o. female for annual exam was  provided with a requisition to go to her local pharmacy to obtain her shingles vaccine. Literature information was provided. Patient was reminded of the importance of monthly breast exam. We discussed importance of calcium vitamin D and regular exercise for osteoporosis prevention. Patient did not receive Pap smear today in accordance with the new guidelines.  Note: This dictation was prepared with  Dragon/digital dictation along withSmart phrase technology. Any transcriptional errors that result from this process are unintentional.   Terrance Mass MD, 9:22 AM 02/15/2014

## 2014-07-14 ENCOUNTER — Ambulatory Visit (INDEPENDENT_AMBULATORY_CARE_PROVIDER_SITE_OTHER): Payer: BC Managed Care – PPO | Admitting: Ophthalmology

## 2014-07-14 ENCOUNTER — Ambulatory Visit (INDEPENDENT_AMBULATORY_CARE_PROVIDER_SITE_OTHER): Payer: BLUE CROSS/BLUE SHIELD | Admitting: Ophthalmology

## 2014-07-14 DIAGNOSIS — H43813 Vitreous degeneration, bilateral: Secondary | ICD-10-CM

## 2014-07-14 DIAGNOSIS — H338 Other retinal detachments: Secondary | ICD-10-CM

## 2014-07-14 DIAGNOSIS — E11319 Type 2 diabetes mellitus with unspecified diabetic retinopathy without macular edema: Secondary | ICD-10-CM

## 2014-07-14 DIAGNOSIS — H33303 Unspecified retinal break, bilateral: Secondary | ICD-10-CM

## 2014-07-14 DIAGNOSIS — E11329 Type 2 diabetes mellitus with mild nonproliferative diabetic retinopathy without macular edema: Secondary | ICD-10-CM

## 2014-10-30 ENCOUNTER — Other Ambulatory Visit: Payer: Self-pay

## 2014-10-30 DIAGNOSIS — Z1231 Encounter for screening mammogram for malignant neoplasm of breast: Secondary | ICD-10-CM

## 2015-01-18 ENCOUNTER — Ambulatory Visit
Admission: RE | Admit: 2015-01-18 | Discharge: 2015-01-18 | Disposition: A | Discharge status: Home / Self Care | Payer: BLUE CROSS/BLUE SHIELD | Source: Ambulatory Visit

## 2015-01-18 DIAGNOSIS — Z1231 Encounter for screening mammogram for malignant neoplasm of breast: Secondary | ICD-10-CM

## 2015-03-05 ENCOUNTER — Other Ambulatory Visit: Payer: Self-pay | Admitting: Gynecology

## 2015-03-05 NOTE — Telephone Encounter (Signed)
Annual on 03/26/15

## 2015-03-26 ENCOUNTER — Encounter: Payer: Self-pay | Admitting: Gynecology

## 2015-03-26 ENCOUNTER — Ambulatory Visit (INDEPENDENT_AMBULATORY_CARE_PROVIDER_SITE_OTHER): Payer: BLUE CROSS/BLUE SHIELD | Admitting: Gynecology

## 2015-03-26 VITALS — BP 110/70 | Ht 65.5 in | Wt 183.0 lb

## 2015-03-26 DIAGNOSIS — Z803 Family history of malignant neoplasm of breast: Secondary | ICD-10-CM | POA: Insufficient documentation

## 2015-03-26 DIAGNOSIS — Z01419 Encounter for gynecological examination (general) (routine) without abnormal findings: Secondary | ICD-10-CM | POA: Diagnosis not present

## 2015-03-26 DIAGNOSIS — Z1159 Encounter for screening for other viral diseases: Secondary | ICD-10-CM

## 2015-03-26 DIAGNOSIS — Z8371 Family history of colonic polyps: Secondary | ICD-10-CM | POA: Insufficient documentation

## 2015-03-26 DIAGNOSIS — Z780 Asymptomatic menopausal state: Secondary | ICD-10-CM | POA: Diagnosis not present

## 2015-03-26 DIAGNOSIS — Z83719 Family history of colon polyps, unspecified: Secondary | ICD-10-CM

## 2015-03-26 DIAGNOSIS — N952 Postmenopausal atrophic vaginitis: Secondary | ICD-10-CM

## 2015-03-26 DIAGNOSIS — Z23 Encounter for immunization: Secondary | ICD-10-CM

## 2015-03-26 DIAGNOSIS — Z8639 Personal history of other endocrine, nutritional and metabolic disease: Secondary | ICD-10-CM

## 2015-03-26 NOTE — Progress Notes (Signed)
Maria Malone 08-Aug-1955 [id number]   History:    59 y.o.  for annual gyn exam with no major complaints with the exception of a small possible follicular cyst near her external genitalia. Patient in July of this year was in [place] and prior to her trip she took the typhoid vaccine and also the malaria vaccine as well as when she returned. She has recently started the hepatitis a and B vaccine series and has received 2 injections and is scheduled for her third next month. Patient is being followed by Dr. Casimiro Needle Altheimer for her type 2 diabetes and hyperlipidemia and has been doing her lab work. Patient denies any past history of abnormal Pap smears. Her mammogram June of this year was normal. Dr. Kinnie Scales had done her colonoscopy back in 2012. Patient's father has a history of colon polyps. Patient's sister had breast cancer at the age of 22 and both the patient and her sister were both tested for the BRCA1 gene and BRCA2 gene mutation and were negative. Patient is using Premarin vaginal cream for her atrophy. Patient denies any vaginal bleeding.Patient had a normal bone density study in 2014.She has had history of vitamin D deficiency in the past. She is currently taking calcium and vitamin D a regular basis and is active exercising regularly. She is a Estate agent.    Past medical history,surgical history, family history and social history were all reviewed and documented in the EPIC chart.  Gynecologic History No LMP recorded. Patient is postmenopausal. Contraception: post menopausal status Last Pap: 2014. Results were: normal Last mammogram: 2016. Results were: normal  Obstetric History OB History  Gravida Para Term Preterm AB SAB TAB Ectopic Multiple Living  0                  ROS: A ROS was performed and pertinent positives and negatives are included in the history.  GENERAL: No fevers or chills. HEENT: No change in vision, no earache, sore throat or sinus  congestion. NECK: No pain or stiffness. CARDIOVASCULAR: No chest pain or pressure. No palpitations. PULMONARY: No shortness of breath, cough or wheeze. GASTROINTESTINAL: No abdominal pain, nausea, vomiting or diarrhea, melena or bright red blood per rectum. GENITOURINARY: No urinary frequency, urgency, hesitancy or dysuria. MUSCULOSKELETAL: No joint or muscle pain, no back pain, no recent trauma. DERMATOLOGIC: No rash, no itching, no lesions. ENDOCRINE: No polyuria, polydipsia, no heat or cold intolerance. No recent change in weight. HEMATOLOGICAL: No anemia or easy bruising or bleeding. NEUROLOGIC: No headache, seizures, numbness, tingling or weakness. PSYCHIATRIC: No depression, no loss of interest in normal activity or change in sleep pattern.     Exam: chaperone present  BP 110/70 mmHg  Ht 5' 5.5" (1.664 m)  Wt 183 lb (83.008 kg)  BMI 29.98 kg/m2  Body mass index is 29.98 kg/(m^2).  General appearance : Well developed well nourished female. No acute distress HEENT: Eyes: no retinal hemorrhage or exudates,  Neck supple, trachea midline, no carotid bruits, no thyroidmegaly Lungs: Clear to auscultation, no rhonchi or wheezes, or rib retractions  Heart: Regular rate and rhythm, no murmurs or gallops Breast:Examined in sitting and supine position were symmetrical in appearance, no palpable masses or tenderness,  no skin retraction, no nipple inversion, no nipple discharge, no skin discoloration, no axillary or supraclavicular lymphadenopathy Abdomen: no palpable masses or tenderness, no rebound or guarding Extremities: no edema or skin discoloration or tenderness  Pelvic:  Bartholin, Urethra, Skene Glands: Within  normal limits             Vagina: No gross lesions or discharge  Cervix: No gross lesions or discharge  Uterus  anteverted, normal size, shape and consistency, non-tender and mobile  Adnexa  Without masses or tenderness  Anus and perineum  normal   Rectovaginal  normal sphincter  tone without palpated masses or tenderness             Hemoccult cards provided     Assessment/Plan:  59 y.o. female for annual exam with small folliculitis near her left buttock seen area. Patient will do sitz baths every night and also use Neosporin cream. If he gets bigger she will return to the office for incision and drainage. It does not appear to be infected. A vitamin D level will be drawn today because of patient's history of vitamin D deficiency. Also she will schedule her bone density study for an next week. Fecal Hemoccult cards were provided for her dismissed to the office for testing. Pap smear not indicated this year.  New CDC guidelines is recommending patients be tested once in her lifetime for hepatitis C antibody who were born between [birth year] through 1964. This was discussed with the patient today and has agreed to be tested today.   Terrance Mass MD, 9:23 AM 03/26/2015

## 2015-03-26 NOTE — Patient Instructions (Signed)

## 2015-03-27 LAB — HEPATITIS C ANTIBODY: HCV AB: NEGATIVE

## 2015-03-27 LAB — VITAMIN D 25 HYDROXY (VIT D DEFICIENCY, FRACTURES): VIT D 25 HYDROXY: 39 ng/mL (ref 30–100)

## 2015-04-05 ENCOUNTER — Ambulatory Visit (INDEPENDENT_AMBULATORY_CARE_PROVIDER_SITE_OTHER): Payer: BLUE CROSS/BLUE SHIELD

## 2015-04-05 ENCOUNTER — Other Ambulatory Visit: Payer: Self-pay | Admitting: Gynecology

## 2015-04-05 DIAGNOSIS — Z780 Asymptomatic menopausal state: Secondary | ICD-10-CM | POA: Diagnosis not present

## 2015-04-05 DIAGNOSIS — Z13820 Encounter for screening for osteoporosis: Secondary | ICD-10-CM

## 2015-04-17 ENCOUNTER — Other Ambulatory Visit: Payer: BLUE CROSS/BLUE SHIELD | Admitting: Anesthesiology

## 2015-04-17 DIAGNOSIS — Z1211 Encounter for screening for malignant neoplasm of colon: Secondary | ICD-10-CM

## 2015-07-20 ENCOUNTER — Ambulatory Visit (INDEPENDENT_AMBULATORY_CARE_PROVIDER_SITE_OTHER): Payer: BLUE CROSS/BLUE SHIELD | Admitting: Ophthalmology

## 2015-07-20 DIAGNOSIS — H43813 Vitreous degeneration, bilateral: Secondary | ICD-10-CM | POA: Diagnosis not present

## 2015-07-20 DIAGNOSIS — H2512 Age-related nuclear cataract, left eye: Secondary | ICD-10-CM

## 2015-07-20 DIAGNOSIS — H31001 Unspecified chorioretinal scars, right eye: Secondary | ICD-10-CM | POA: Diagnosis not present

## 2015-07-20 DIAGNOSIS — H338 Other retinal detachments: Secondary | ICD-10-CM

## 2015-07-20 DIAGNOSIS — H33302 Unspecified retinal break, left eye: Secondary | ICD-10-CM | POA: Diagnosis not present

## 2015-10-26 ENCOUNTER — Other Ambulatory Visit: Payer: Self-pay | Admitting: Gynecology

## 2016-01-07 ENCOUNTER — Other Ambulatory Visit: Payer: Self-pay | Admitting: Gynecology

## 2016-01-07 DIAGNOSIS — Z1231 Encounter for screening mammogram for malignant neoplasm of breast: Secondary | ICD-10-CM

## 2016-01-21 ENCOUNTER — Ambulatory Visit
Admission: RE | Admit: 2016-01-21 | Discharge: 2016-01-21 | Disposition: A | Discharge status: Home / Self Care | Payer: BLUE CROSS/BLUE SHIELD | Source: Ambulatory Visit | Attending: Gynecology | Admitting: Gynecology

## 2016-01-21 DIAGNOSIS — Z1231 Encounter for screening mammogram for malignant neoplasm of breast: Secondary | ICD-10-CM

## 2016-03-26 ENCOUNTER — Ambulatory Visit (INDEPENDENT_AMBULATORY_CARE_PROVIDER_SITE_OTHER): Payer: BLUE CROSS/BLUE SHIELD | Admitting: Gynecology

## 2016-03-26 ENCOUNTER — Encounter: Payer: Self-pay | Admitting: Gynecology

## 2016-03-26 VITALS — BP 120/70 | Ht 65.0 in | Wt 181.0 lb

## 2016-03-26 DIAGNOSIS — N952 Postmenopausal atrophic vaginitis: Secondary | ICD-10-CM | POA: Diagnosis not present

## 2016-03-26 DIAGNOSIS — Z8639 Personal history of other endocrine, nutritional and metabolic disease: Secondary | ICD-10-CM | POA: Insufficient documentation

## 2016-03-26 DIAGNOSIS — Z01419 Encounter for gynecological examination (general) (routine) without abnormal findings: Secondary | ICD-10-CM

## 2016-03-26 DIAGNOSIS — Z9229 Personal history of other drug therapy: Secondary | ICD-10-CM | POA: Diagnosis not present

## 2016-03-26 NOTE — Progress Notes (Signed)
Maria Malone Sep 22, 1955 [id number]   History:    60 y.o.  for annual gyn exam with no complaints today.Patient is being followed by Dr. Legrand Como Altheimer for her type 2 diabetes and hyperlipidemia and has been doing her lab work. Patient denies any past history of abnormal Pap smears. Her mammogram June of this year was normal. Dr. Earlean Shawl had done her colonoscopy back in 2012. Patient's father has a history of colon polyps. Patient's sister had breast cancer at the age of 26 and both the patient and her sister were both tested for the BRCA1 gene and BRCA2 gene mutation and were negative. Patient is using Premarin vaginal cream for her atrophy. Patient denies any vaginal bleeding.Patient had a normal bone density study in 2016.She has had history of vitamin D deficiency in the past. She is currently taking calcium and vitamin D a regular basis and is active exercising regularly. She is a Naval architect. Patient received her flu vaccine at her PCP office recently.   Past medical history,surgical history, family history and social history were all reviewed and documented in the EPIC chart.  Gynecologic History No LMP recorded. Patient is postmenopausal. Contraception: post menopausal status Last Pap: 2014. Results were: normal Last mammogram: 2017. Results were: normal  Obstetric History OB History  Gravida Para Term Preterm AB Living  0            SAB TAB Ectopic Multiple Live Births                    ROS: A ROS was performed and pertinent positives and negatives are included in the history.  GENERAL: No fevers or chills. HEENT: No change in vision, no earache, sore throat or sinus congestion. NECK: No pain or stiffness. CARDIOVASCULAR: No chest pain or pressure. No palpitations. PULMONARY: No shortness of breath, cough or wheeze. GASTROINTESTINAL: No abdominal pain, nausea, vomiting or diarrhea, melena or bright red blood per rectum. GENITOURINARY: No urinary  frequency, urgency, hesitancy or dysuria. MUSCULOSKELETAL: No joint or muscle pain, no back pain, no recent trauma. DERMATOLOGIC: No rash, no itching, no lesions. ENDOCRINE: No polyuria, polydipsia, no heat or cold intolerance. No recent change in weight. HEMATOLOGICAL: No anemia or easy bruising or bleeding. NEUROLOGIC: No headache, seizures, numbness, tingling or weakness. PSYCHIATRIC: No depression, no loss of interest in normal activity or change in sleep pattern.     Exam: chaperone present  BP 120/70   Ht '5\' 5"'$  (1.651 m)   Wt 181 lb (82.1 kg)   BMI 30.12 kg/m   Body mass index is 30.12 kg/m.  General appearance : Well developed well nourished female. No acute distress HEENT: Eyes: no retinal hemorrhage or exudates,  Neck supple, trachea midline, no carotid bruits, no thyroidmegaly Lungs: Clear to auscultation, no rhonchi or wheezes, or rib retractions  Heart: Regular rate and rhythm, no murmurs or gallops Breast:Examined in sitting and supine position were symmetrical in appearance, no palpable masses or tenderness,  no skin retraction, no nipple inversion, no nipple discharge, no skin discoloration, no axillary or supraclavicular lymphadenopathy Abdomen: no palpable masses or tenderness, no rebound or guarding Extremities: no edema or skin discoloration or tenderness  Pelvic:  Bartholin, Urethra, Skene Glands: Within normal limits             Vagina: No gross lesions or discharge  Cervix: No gross lesions or discharge  Uterus  anteverted, normal size, shape and consistency, non-tender and mobile  Adnexa  Without masses or tenderness  Anus and perineum  normal   Rectovaginal  normal sphincter tone without palpated masses or tenderness             Hemoccult cards provided     Assessment/Plan:  60 y.o. female for annual exam doing well on vaginal estrogen twice a week. PCP did her blood work and her flu vaccine is up-to-date. Pap smear was done today. She will need her bone  density study next year. We discussed importance of calcium vitamin D and weightbearing exercises for osteoporosis prevention. She was provided with fecal Hemoccult card to [facility] to the office for testing.   Terrance Mass MD, 4:18 PM 03/26/2016

## 2016-03-26 NOTE — Addendum Note (Signed)
Addended by: Berna SpareASTILLO, BLANCA A on: 03/26/2016 05:06 PM   Modules accepted: Orders

## 2016-03-27 LAB — PAP IG W/ RFLX HPV ASCU

## 2016-04-09 ENCOUNTER — Other Ambulatory Visit: Payer: Self-pay | Admitting: Anesthesiology

## 2016-04-09 DIAGNOSIS — Z1211 Encounter for screening for malignant neoplasm of colon: Secondary | ICD-10-CM

## 2016-05-15 ENCOUNTER — Encounter: Payer: Self-pay | Admitting: Gynecology

## 2016-07-25 ENCOUNTER — Ambulatory Visit (INDEPENDENT_AMBULATORY_CARE_PROVIDER_SITE_OTHER): Payer: BLUE CROSS/BLUE SHIELD | Admitting: Ophthalmology

## 2016-07-25 DIAGNOSIS — H33302 Unspecified retinal break, left eye: Secondary | ICD-10-CM | POA: Diagnosis not present

## 2016-07-25 DIAGNOSIS — H31003 Unspecified chorioretinal scars, bilateral: Secondary | ICD-10-CM | POA: Diagnosis not present

## 2016-07-25 DIAGNOSIS — H43813 Vitreous degeneration, bilateral: Secondary | ICD-10-CM

## 2016-07-25 DIAGNOSIS — H338 Other retinal detachments: Secondary | ICD-10-CM | POA: Diagnosis not present

## 2016-10-22 ENCOUNTER — Encounter: Payer: Self-pay | Admitting: Gynecology

## 2016-12-30 ENCOUNTER — Other Ambulatory Visit: Payer: Self-pay | Admitting: Obstetrics & Gynecology

## 2016-12-30 ENCOUNTER — Other Ambulatory Visit: Payer: Self-pay | Admitting: Gynecology

## 2016-12-30 DIAGNOSIS — Z1231 Encounter for screening mammogram for malignant neoplasm of breast: Secondary | ICD-10-CM

## 2017-01-21 ENCOUNTER — Ambulatory Visit
Admission: RE | Admit: 2017-01-21 | Discharge: 2017-01-21 | Disposition: A | Discharge status: Home / Self Care | Payer: BLUE CROSS/BLUE SHIELD | Source: Ambulatory Visit | Attending: Obstetrics & Gynecology | Admitting: Obstetrics & Gynecology

## 2017-01-21 DIAGNOSIS — Z1231 Encounter for screening mammogram for malignant neoplasm of breast: Secondary | ICD-10-CM

## 2017-03-27 ENCOUNTER — Encounter: Payer: BLUE CROSS/BLUE SHIELD | Admitting: Gynecology

## 2017-03-27 ENCOUNTER — Encounter: Payer: BLUE CROSS/BLUE SHIELD | Admitting: Obstetrics & Gynecology

## 2017-03-30 ENCOUNTER — Encounter: Payer: BLUE CROSS/BLUE SHIELD | Admitting: Obstetrics & Gynecology

## 2017-04-06 ENCOUNTER — Ambulatory Visit (INDEPENDENT_AMBULATORY_CARE_PROVIDER_SITE_OTHER): Payer: BLUE CROSS/BLUE SHIELD | Admitting: Obstetrics & Gynecology

## 2017-04-06 ENCOUNTER — Encounter: Payer: Self-pay | Admitting: Obstetrics & Gynecology

## 2017-04-06 ENCOUNTER — Telehealth: Payer: Self-pay | Admitting: *Deleted

## 2017-04-06 VITALS — BP 132/86 | Ht 65.25 in | Wt 174.0 lb

## 2017-04-06 DIAGNOSIS — Z780 Asymptomatic menopausal state: Secondary | ICD-10-CM

## 2017-04-06 DIAGNOSIS — N6320 Unspecified lump in the left breast, unspecified quadrant: Secondary | ICD-10-CM

## 2017-04-06 DIAGNOSIS — Z23 Encounter for immunization: Secondary | ICD-10-CM | POA: Diagnosis not present

## 2017-04-06 DIAGNOSIS — Z01419 Encounter for gynecological examination (general) (routine) without abnormal findings: Secondary | ICD-10-CM | POA: Diagnosis not present

## 2017-04-06 DIAGNOSIS — N63 Unspecified lump in unspecified breast: Secondary | ICD-10-CM

## 2017-04-06 NOTE — Telephone Encounter (Signed)
-----   Message from Genia DelMarie-Lyne Lavoie, MD sent at 04/06/2017 10:30 AM EDT ----- Regarding: Left Dx Mammo/Breast US Left breast nodule 1 x 1 cm at 6 O'clock.

## 2017-04-06 NOTE — Patient Instructions (Addendum)
1. Encounter for routine gynecological examination with Papanicolaou smear of cervix Normal gyn exam.  Pap reflex done.  Breasts wnl.  Mammo neg 01/2017.  2. Menopause present No HRT.  No PMB.  Was using Estradiol cream, but abstinent.  Recommend to stop using.  Vit D supplement/Ca++ in food/Weight bearing physical actity.  Last Bone Density normal 2016.  Will repeat in 2019.  3. Left breast lump 1 cm nodule felt at 6 O'clock.  Will proceed with Lt Dx mammo/US.  Maria Malone, it was a pleasure to meet you today.  I will inform you of your results as soon as available.   Health Maintenance for Postmenopausal Women Menopause is a normal process in which your reproductive ability comes to an end. This process happens gradually over a span of months to years, usually between the ages of 75 and 54. Menopause is complete when you have missed 12 consecutive menstrual periods. It is important to talk with your health care provider about some of the most common conditions that affect postmenopausal women, such as heart disease, cancer, and bone loss (osteoporosis). Adopting a healthy lifestyle and getting preventive care can help to promote your health and wellness. Those actions can also lower your chances of developing some of these common conditions. What should I know about menopause? During menopause, you may experience a number of symptoms, such as:  Moderate-to-severe hot flashes.  Night sweats.  Decrease in sex drive.  Mood swings.  Headaches.  Tiredness.  Irritability.  Memory problems.  Insomnia.  Choosing to treat or not to treat menopausal changes is an individual decision that you make with your health care provider. What should I know about hormone replacement therapy and supplements? Hormone therapy products are effective for treating symptoms that are associated with menopause, such as hot flashes and night sweats. Hormone replacement carries certain risks, especially as you  become older. If you are thinking about using estrogen or estrogen with progestin treatments, discuss the benefits and risks with your health care provider. What should I know about heart disease and stroke? Heart disease, heart attack, and stroke become more likely as you age. This may be due, in part, to the hormonal changes that your body experiences during menopause. These can affect how your body processes dietary fats, triglycerides, and cholesterol. Heart attack and stroke are both medical emergencies. There are many things that you can do to help prevent heart disease and stroke:  Have your blood pressure checked at least every 1-2 years. High blood pressure causes heart disease and increases the risk of stroke.  If you are 64-53 years old, ask your health care provider if you should take aspirin to prevent a heart attack or a stroke.  Do not use any tobacco products, including cigarettes, chewing tobacco, or electronic cigarettes. If you need help quitting, ask your health care provider.  It is important to eat a healthy diet and maintain a healthy weight. ? Be sure to include plenty of vegetables, fruits, low-fat dairy products, and lean protein. ? Avoid eating foods that are high in solid fats, added sugars, or salt (sodium).  Get regular exercise. This is one of the most important things that you can do for your health. ? Try to exercise for at least 150 minutes each week. The type of exercise that you do should increase your heart rate and make you sweat. This is known as moderate-intensity exercise. ? Try to do strengthening exercises at least twice each week. Do these in  addition to the moderate-intensity exercise.  Know your numbers.Ask your health care provider to check your cholesterol and your blood glucose. Continue to have your blood tested as directed by your health care provider.  What should I know about cancer screening? There are several types of cancer. Take the  following steps to reduce your risk and to catch any cancer development as early as possible. Breast Cancer  Practice breast self-awareness. ? This means understanding how your breasts normally appear and feel. ? It also means doing regular breast self-exams. Let your health care provider know about any changes, no matter how small.  If you are 71 or older, have a clinician do a breast exam (clinical breast exam or CBE) every year. Depending on your age, family history, and medical history, it may be recommended that you also have a yearly breast X-ray (mammogram).  If you have a family history of breast cancer, talk with your health care provider about genetic screening.  If you are at high risk for breast cancer, talk with your health care provider about having an MRI and a mammogram every year.  Breast cancer (BRCA) gene test is recommended for women who have family members with BRCA-related cancers. Results of the assessment will determine the need for genetic counseling and BRCA1 and for BRCA2 testing. BRCA-related cancers include these types: ? Breast. This occurs in males or females. ? Ovarian. ? Tubal. This may also be called fallopian tube cancer. ? Cancer of the abdominal or pelvic lining (peritoneal cancer). ? Prostate. ? Pancreatic.  Cervical, Uterine, and Ovarian Cancer Your health care provider may recommend that you be screened regularly for cancer of the pelvic organs. These include your ovaries, uterus, and vagina. This screening involves a pelvic exam, which includes checking for microscopic changes to the surface of your cervix (Pap test).  For women ages 21-65, health care providers may recommend a pelvic exam and a Pap test every three years. For women ages 67-65, they may recommend the Pap test and pelvic exam, combined with testing for human papilloma virus (HPV), every five years. Some types of HPV increase your risk of cervical cancer. Testing for HPV may also be done  on women of any age who have unclear Pap test results.  Other health care providers may not recommend any screening for nonpregnant women who are considered low risk for pelvic cancer and have no symptoms. Ask your health care provider if a screening pelvic exam is right for you.  If you have had past treatment for cervical cancer or a condition that could lead to cancer, you need Pap tests and screening for cancer for at least 20 years after your treatment. If Pap tests have been discontinued for you, your risk factors (such as having a new sexual partner) need to be reassessed to determine if you should start having screenings again. Some women have medical problems that increase the chance of getting cervical cancer. In these cases, your health care provider may recommend that you have screening and Pap tests more often.  If you have a family history of uterine cancer or ovarian cancer, talk with your health care provider about genetic screening.  If you have vaginal bleeding after reaching menopause, tell your health care provider.  There are currently no reliable tests available to screen for ovarian cancer.  Lung Cancer Lung cancer screening is recommended for adults 74-53 years old who are at high risk for lung cancer because of a history of smoking. A  yearly low-dose CT scan of the lungs is recommended if you:  Currently smoke.  Have a history of at least 30 pack-years of smoking and you currently smoke or have quit within the past 15 years. A pack-year is smoking an average of one pack of cigarettes per day for one year.  Yearly screening should:  Continue until it has been 15 years since you quit.  Stop if you develop a health problem that would prevent you from having lung cancer treatment.  Colorectal Cancer  This type of cancer can be detected and can often be prevented.  Routine colorectal cancer screening usually begins at age 70 and continues through age 65.  If you  have risk factors for colon cancer, your health care provider may recommend that you be screened at an earlier age.  If you have a family history of colorectal cancer, talk with your health care provider about genetic screening.  Your health care provider may also recommend using home test kits to check for hidden blood in your stool.  A small camera at the end of a tube can be used to examine your colon directly (sigmoidoscopy or colonoscopy). This is done to check for the earliest forms of colorectal cancer.  Direct examination of the colon should be repeated every 5-10 years until age 53. However, if early forms of precancerous polyps or small growths are found or if you have a family history or genetic risk for colorectal cancer, you may need to be screened more often.  Skin Cancer  Check your skin from head to toe regularly.  Monitor any moles. Be sure to tell your health care provider: ? About any new moles or changes in moles, especially if there is a change in a mole's shape or color. ? If you have a mole that is larger than the size of a pencil eraser.  If any of your family members has a history of skin cancer, especially at a young age, talk with your health care provider about genetic screening.  Always use sunscreen. Apply sunscreen liberally and repeatedly throughout the day.  Whenever you are outside, protect yourself by wearing long sleeves, pants, a wide-brimmed hat, and sunglasses.  What should I know about osteoporosis? Osteoporosis is a condition in which bone destruction happens more quickly than new bone creation. After menopause, you may be at an increased risk for osteoporosis. To help prevent osteoporosis or the bone fractures that can happen because of osteoporosis, the following is recommended:  If you are 72-27 years old, get at least 1,000 mg of calcium and at least 600 mg of vitamin D per day.  If you are older than age 107 but younger than age 74, get at  least 1,200 mg of calcium and at least 600 mg of vitamin D per day.  If you are older than age 62, get at least 1,200 mg of calcium and at least 800 mg of vitamin D per day.  Smoking and excessive alcohol intake increase the risk of osteoporosis. Eat foods that are rich in calcium and vitamin D, and do weight-bearing exercises several times each week as directed by your health care provider. What should I know about how menopause affects my mental health? Depression may occur at any age, but it is more common as you become older. Common symptoms of depression include:  Low or sad mood.  Changes in sleep patterns.  Changes in appetite or eating patterns.  Feeling an overall lack of motivation or  enjoyment of activities that you previously enjoyed.  Frequent crying spells.  Talk with your health care provider if you think that you are experiencing depression. What should I know about immunizations? It is important that you get and maintain your immunizations. These include:  Tetanus, diphtheria, and pertussis (Tdap) booster vaccine.  Influenza every year before the flu season begins.  Pneumonia vaccine.  Shingles vaccine.  Your health care provider may also recommend other immunizations. This information is not intended to replace advice given to you by your health care provider. Make sure you discuss any questions you have with your health care provider. Document Released: 07/18/2005 Document Revised: 12/14/2015 Document Reviewed: 02/27/2015 Elsevier Interactive Patient Education  2018 [employer].

## 2017-04-06 NOTE — Progress Notes (Signed)
Maria Malone 29-Aug-1955 [id number]   History:    61 y.o. G0 single.  Phys Ed Teacher  RP:  Established patient presenting for annual gyn exam   HPI:  Menopause.  No systemic HRT, but on Estradiol cream twice a week.  Virgin.  No PMB.  No pelvic pain.  Breasts wnl.  Mictions/BMs wnl.  DM type 2 followed by Dr Altheimer.  Just started a more strict low carb/glucose diet and increased physical activity.  BMI 28.73  Past medical history,surgical history, family history and social history were all reviewed and documented in the EPIC chart.  Gynecologic History No LMP recorded. Patient is postmenopausal. Contraception: abstinence and post menopausal status Last Pap: 2017. Results were: normal Last mammogram: 01/2017. Results were: Neg Bone density normal 03/2015 Colono 2017  Obstetric History OB History  Gravida Para Term Preterm AB Living  0            SAB TAB Ectopic Multiple Live Births                    ROS: A ROS was performed and pertinent positives and negatives are included in the history.  GENERAL: No fevers or chills. HEENT: No change in vision, no earache, sore throat or sinus congestion. NECK: No pain or stiffness. CARDIOVASCULAR: No chest pain or pressure. No palpitations. PULMONARY: No shortness of breath, cough or wheeze. GASTROINTESTINAL: No abdominal pain, nausea, vomiting or diarrhea, melena or bright red blood per rectum. GENITOURINARY: No urinary frequency, urgency, hesitancy or dysuria. MUSCULOSKELETAL: No joint or muscle pain, no back pain, no recent trauma. DERMATOLOGIC: No rash, no itching, no lesions. ENDOCRINE: No polyuria, polydipsia, no heat or cold intolerance. No recent change in weight. HEMATOLOGICAL: No anemia or easy bruising or bleeding. NEUROLOGIC: No headache, seizures, numbness, tingling or weakness. PSYCHIATRIC: No depression, no loss of interest in normal activity or change in sleep pattern.     Exam:   BP 132/86   Ht 5' 5.25" (1.657 m)    Wt 174 lb (78.9 kg)   BMI 28.73 kg/m   Body mass index is 28.73 kg/m.  General appearance : Well developed well nourished female. No acute distress HEENT: Eyes: no retinal hemorrhage or exudates,  Neck supple, trachea midline, no carotid bruits, no thyroidmegaly Lungs: Clear to auscultation, no rhonchi or wheezes, or rib retractions  Heart: Regular rate and rhythm, no murmurs or gallops Breast:Examined in sitting and supine position. Rt breast no palpable masses or tenderness,  no skin retraction, no nipple inversion, no nipple discharge, no skin discoloration, no axillary or supraclavicular lymphadenopathy.  Lt breast 1 x 1 cm soft nodule at 6 O'clock, NT.  Otherwise normal.  No Lt axillary LN. Abdomen: no palpable masses or tenderness, no rebound or guarding Extremities: no edema or skin discoloration or tenderness  Pelvic: Vulva normal  Bartholin, Urethra, Skene Glands: Within normal limits             Vagina: No gross lesions or discharge  Cervix: No gross lesions or discharge.  Pap reflex done.  Uterus  AV, normal size, shape and consistency, non-tender and mobile  Adnexa  Without masses or tenderness  Anus and perineum  normal   Assessment/Plan:  61 y.o. female for annual exam   1. Encounter for routine gynecological examination with Papanicolaou smear of cervix Normal gyn exam.  Pap reflex done.  Breasts wnl.  Mammo neg 01/2017.  2. Menopause present No HRT.  No PMB.  Was  using Estradiol cream, but abstinent.  Recommend to stop using.  Vit D supplement/Ca++ in food/Weight bearing physical actity.  Last Bone Density normal 2016.  Will repeat in 2019.  3. Left breast lump 1 cm nodule felt at 6 O'clock.  Will proceed with Lt Dx mammo/US.  Genia Del MD, 10:10 AM 04/06/2017

## 2017-04-06 NOTE — Telephone Encounter (Signed)
Appointment 04/10/17 @ 10:20pm pt aware.

## 2017-04-08 LAB — PAP IG W/ RFLX HPV ASCU

## 2017-04-10 ENCOUNTER — Other Ambulatory Visit: Payer: BLUE CROSS/BLUE SHIELD

## 2017-04-13 ENCOUNTER — Ambulatory Visit
Admission: RE | Admit: 2017-04-13 | Discharge: 2017-04-13 | Disposition: A | Discharge status: Home / Self Care | Payer: BLUE CROSS/BLUE SHIELD | Source: Ambulatory Visit | Attending: Obstetrics & Gynecology | Admitting: Obstetrics & Gynecology

## 2017-04-13 DIAGNOSIS — N63 Unspecified lump in unspecified breast: Secondary | ICD-10-CM

## 2017-08-07 ENCOUNTER — Ambulatory Visit (INDEPENDENT_AMBULATORY_CARE_PROVIDER_SITE_OTHER): Payer: BLUE CROSS/BLUE SHIELD | Admitting: Ophthalmology

## 2017-08-07 DIAGNOSIS — H338 Other retinal detachments: Secondary | ICD-10-CM | POA: Diagnosis not present

## 2017-08-07 DIAGNOSIS — H43813 Vitreous degeneration, bilateral: Secondary | ICD-10-CM

## 2017-08-07 DIAGNOSIS — H33303 Unspecified retinal break, bilateral: Secondary | ICD-10-CM | POA: Diagnosis not present

## 2017-08-07 DIAGNOSIS — H2513 Age-related nuclear cataract, bilateral: Secondary | ICD-10-CM

## 2017-08-07 DIAGNOSIS — H31003 Unspecified chorioretinal scars, bilateral: Secondary | ICD-10-CM

## 2017-08-07 HISTORY — PX: TRIGGER FINGER RELEASE: SHX641

## 2017-12-28 ENCOUNTER — Other Ambulatory Visit: Payer: Self-pay | Admitting: Obstetrics & Gynecology

## 2017-12-28 DIAGNOSIS — Z1231 Encounter for screening mammogram for malignant neoplasm of breast: Secondary | ICD-10-CM

## 2018-01-25 ENCOUNTER — Ambulatory Visit
Admission: RE | Admit: 2018-01-25 | Discharge: 2018-01-25 | Disposition: A | Discharge status: Home / Self Care | Payer: BLUE CROSS/BLUE SHIELD | Source: Ambulatory Visit | Attending: Obstetrics & Gynecology | Admitting: Obstetrics & Gynecology

## 2018-01-25 DIAGNOSIS — Z1231 Encounter for screening mammogram for malignant neoplasm of breast: Secondary | ICD-10-CM

## 2018-04-07 ENCOUNTER — Encounter: Payer: BLUE CROSS/BLUE SHIELD | Admitting: Obstetrics & Gynecology

## 2018-06-14 ENCOUNTER — Encounter: Payer: Self-pay | Admitting: Obstetrics & Gynecology

## 2018-06-14 ENCOUNTER — Ambulatory Visit (INDEPENDENT_AMBULATORY_CARE_PROVIDER_SITE_OTHER): Payer: BLUE CROSS/BLUE SHIELD | Admitting: Obstetrics & Gynecology

## 2018-06-14 VITALS — BP 130/82 | Ht 65.0 in | Wt 167.2 lb

## 2018-06-14 DIAGNOSIS — E663 Overweight: Secondary | ICD-10-CM | POA: Diagnosis not present

## 2018-06-14 DIAGNOSIS — Z01419 Encounter for gynecological examination (general) (routine) without abnormal findings: Secondary | ICD-10-CM

## 2018-06-14 DIAGNOSIS — E119 Type 2 diabetes mellitus without complications: Secondary | ICD-10-CM | POA: Diagnosis not present

## 2018-06-14 DIAGNOSIS — Z780 Asymptomatic menopausal state: Secondary | ICD-10-CM

## 2018-06-14 NOTE — Progress Notes (Signed)
Maria Malone 1955/08/19 [id number]   History:    63 y.o. G0 Single  RP:  Established patient presenting for annual gyn exam   HPI: Postmenopausal well on no HRT.  No PMB.  No pelvic pain.  Abstinence.  Urine/BMs normal.  Breasts normal.  Sister Dx Breast Cancer at age 62, now treated for mets to the lungs.  BMI 27.82.  DM Type 2 on Metformin.  Exercising regularly, decreasing calories in nutrition.  Health Labs with Endocrinologist.  Past medical history,surgical history, family history and social history were all reviewed and documented in the EPIC chart.  Gynecologic History No LMP recorded. Patient is postmenopausal. Contraception: abstinence and post menopausal status Last Pap: 03/2017. Results were: Negative Last mammogram: 01/2018. Results were: Negative Bone Density: 03/2015 Normal.  Repeat at 5 years Colonoscopy: 2018  Obstetric History OB History  Gravida Para Term Preterm AB Living  0            SAB TAB Ectopic Multiple Live Births                ROS: A ROS was performed and pertinent positives and negatives are included in the history.  GENERAL: No fevers or chills. HEENT: No change in vision, no earache, sore throat or sinus congestion. NECK: No pain or stiffness. CARDIOVASCULAR: No chest pain or pressure. No palpitations. PULMONARY: No shortness of breath, cough or wheeze. GASTROINTESTINAL: No abdominal pain, nausea, vomiting or diarrhea, melena or bright red blood per rectum. GENITOURINARY: No urinary frequency, urgency, hesitancy or dysuria. MUSCULOSKELETAL: No joint or muscle pain, no back pain, no recent trauma. DERMATOLOGIC: No rash, no itching, no lesions. ENDOCRINE: No polyuria, polydipsia, no heat or cold intolerance. No recent change in weight. HEMATOLOGICAL: No anemia or easy bruising or bleeding. NEUROLOGIC: No headache, seizures, numbness, tingling or weakness. PSYCHIATRIC: No depression, no loss of interest in normal activity or change in sleep pattern.     Exam:   BP 130/82   Ht 5\' 5"  (1.651 m)   Wt 167 lb 3.2 oz (75.8 kg)   BMI 27.82 kg/m   Body mass index is 27.82 kg/m.  General appearance : Well developed well nourished female. No acute distress HEENT: Eyes: no retinal hemorrhage or exudates,  Neck supple, trachea midline, no carotid bruits, no thyroidmegaly Lungs: Clear to auscultation, no rhonchi or wheezes, or rib retractions  Heart: Regular rate and rhythm, no murmurs or gallops Breast:Examined in sitting and supine position were symmetrical in appearance, no palpable masses or tenderness,  no skin retraction, no nipple inversion, no nipple discharge, no skin discoloration, no axillary or supraclavicular lymphadenopathy Abdomen: no palpable masses or tenderness, no rebound or guarding Extremities: no edema or skin discoloration or tenderness  Pelvic: Vulva: Normal             Vagina: No gross lesions or discharge  Cervix: No gross lesions or discharge  Uterus AV, normal size, shape and consistency, non-tender and mobile  Adnexa  Without masses or tenderness  Anus: Normal   Assessment/Plan:  63 y.o. female for annual exam   1. Well woman exam with routine gynecological exam Normal gynecologic exam.  Pap test was negative in October 2018.  No indication to repeat this year.  Breast exam normal.  Last screening mammogram August 2019 was negative.  Colonoscopy in 2018.  Health labs with family physician.  2. Postmenopausal Well on no hormone replacement therapy.  No postmenopausal bleeding.  Bone density in October 2016 was normal.  Will repeat in October 2021.  Vitamin D supplements, calcium intake of 1.2 to 1.5 g/day and regular weightbearing physical activity recommended.  3. Type 2 diabetes mellitus without complication, without long-term current use of insulin (HCC) Continue close management with endocrinologist.  4. Overweight (BMI 25.0-29.9) Recommend a lower calorie nutrition and increase physical activity with  aerobic activities 5 times a week and weightlifting every 2 days.  Genia Del MD, 9:37 AM 06/14/2018

## 2018-06-18 ENCOUNTER — Encounter: Payer: Self-pay | Admitting: Obstetrics & Gynecology

## 2018-06-18 NOTE — Patient Instructions (Signed)
1. Well woman exam with routine gynecological exam Normal gynecologic exam.  Pap test was negative in October 2018.  No indication to repeat this year.  Breast exam normal.  Last screening mammogram August 2019 was negative.  Colonoscopy in 2018.  Health labs with family physician.  2. Postmenopausal Well on no hormone replacement therapy.  No postmenopausal bleeding.  Bone density in October 2016 was normal.  Will repeat in October 2021.  Vitamin D supplements, calcium intake of 1.2 to 1.5 g/day and regular weightbearing physical activity recommended.  3. Type 2 diabetes mellitus without complication, without long-term current use of insulin (HCC) Continue close management with endocrinologist.  4. Overweight (BMI 25.0-29.9) Recommend a lower calorie nutrition and increase physical activity with aerobic activities 5 times a week and weightlifting every 2 days.  Maria Malone, it was a pleasure seeing you today!

## 2018-06-28 ENCOUNTER — Encounter: Payer: BLUE CROSS/BLUE SHIELD | Admitting: Obstetrics & Gynecology

## 2018-08-13 ENCOUNTER — Encounter (INDEPENDENT_AMBULATORY_CARE_PROVIDER_SITE_OTHER): Payer: BLUE CROSS/BLUE SHIELD | Admitting: Ophthalmology

## 2018-08-13 DIAGNOSIS — H35341 Macular cyst, hole, or pseudohole, right eye: Secondary | ICD-10-CM

## 2018-08-13 DIAGNOSIS — H338 Other retinal detachments: Secondary | ICD-10-CM

## 2018-08-13 DIAGNOSIS — H33302 Unspecified retinal break, left eye: Secondary | ICD-10-CM | POA: Diagnosis not present

## 2018-08-13 DIAGNOSIS — H43813 Vitreous degeneration, bilateral: Secondary | ICD-10-CM

## 2018-12-30 ENCOUNTER — Other Ambulatory Visit: Payer: Self-pay | Admitting: Obstetrics & Gynecology

## 2018-12-30 DIAGNOSIS — Z1231 Encounter for screening mammogram for malignant neoplasm of breast: Secondary | ICD-10-CM

## 2019-02-18 ENCOUNTER — Other Ambulatory Visit: Payer: Self-pay

## 2019-02-18 ENCOUNTER — Ambulatory Visit
Admission: RE | Admit: 2019-02-18 | Discharge: 2019-02-18 | Disposition: A | Discharge status: Home / Self Care | Payer: BC Managed Care – PPO | Source: Ambulatory Visit | Attending: Obstetrics & Gynecology | Admitting: Obstetrics & Gynecology

## 2019-02-18 DIAGNOSIS — Z1231 Encounter for screening mammogram for malignant neoplasm of breast: Secondary | ICD-10-CM

## 2019-06-16 ENCOUNTER — Other Ambulatory Visit: Payer: Self-pay

## 2019-06-17 ENCOUNTER — Ambulatory Visit (INDEPENDENT_AMBULATORY_CARE_PROVIDER_SITE_OTHER): Payer: 59 | Admitting: Obstetrics & Gynecology

## 2019-06-17 ENCOUNTER — Encounter: Payer: Self-pay | Admitting: Obstetrics & Gynecology

## 2019-06-17 VITALS — BP 124/80 | Ht 65.5 in | Wt 173.0 lb

## 2019-06-17 DIAGNOSIS — Z01419 Encounter for gynecological examination (general) (routine) without abnormal findings: Secondary | ICD-10-CM

## 2019-06-17 DIAGNOSIS — Z780 Asymptomatic menopausal state: Secondary | ICD-10-CM

## 2019-06-17 DIAGNOSIS — E663 Overweight: Secondary | ICD-10-CM

## 2019-06-17 DIAGNOSIS — Z13820 Encounter for screening for osteoporosis: Secondary | ICD-10-CM | POA: Diagnosis not present

## 2019-06-17 DIAGNOSIS — E119 Type 2 diabetes mellitus without complications: Secondary | ICD-10-CM

## 2019-06-17 NOTE — Progress Notes (Signed)
Maria Malone [birth year] [id number]   History:    64 y.o. G0 Single  RP:  Established patient presenting for annual gyn exam   HPI: Postmenopausal well on no HRT.  No PMB.  No pelvic pain.  Abstinence.  Urine/BMs normal.  Breasts normal.  Sister Dx Breast Cancer at age 37, now treated for mets to the lungs.  BMI 28.35.  DM Type 2 on Metformin.  Exercising regularly, decreasing calories in nutrition.  Health Labs with Endocrinologist.   Past medical history,surgical history, family history and social history were all reviewed and documented in the EPIC chart.  Gynecologic History No LMP recorded. Patient is postmenopausal.  Obstetric History OB History  Gravida Para Term Preterm AB Living  0            SAB TAB Ectopic Multiple Live Births                ROS: A ROS was performed and pertinent positives and negatives are included in the history.  GENERAL: No fevers or chills. HEENT: No change in vision, no earache, sore throat or sinus congestion. NECK: No pain or stiffness. CARDIOVASCULAR: No chest pain or pressure. No palpitations. PULMONARY: No shortness of breath, cough or wheeze. GASTROINTESTINAL: No abdominal pain, nausea, vomiting or diarrhea, melena or bright red blood per rectum. GENITOURINARY: No urinary frequency, urgency, hesitancy or dysuria. MUSCULOSKELETAL: No joint or muscle pain, no back pain, no recent trauma. DERMATOLOGIC: No rash, no itching, no lesions. ENDOCRINE: No polyuria, polydipsia, no heat or cold intolerance. No recent change in weight. HEMATOLOGICAL: No anemia or easy bruising or bleeding. NEUROLOGIC: No headache, seizures, numbness, tingling or weakness. PSYCHIATRIC: No depression, no loss of interest in normal activity or change in sleep pattern.     Exam:   BP 124/80   Ht 5' 5.5" (1.664 m)   Wt 173 lb (78.5 kg)   BMI 28.35 kg/m   Body mass index is 28.35 kg/m.  General appearance : Well developed well nourished female. No acute  distress HEENT: Eyes: no retinal hemorrhage or exudates,  Neck supple, trachea midline, no carotid bruits, no thyroidmegaly Lungs: Clear to auscultation, no rhonchi or wheezes, or rib retractions  Heart: Regular rate and rhythm, no murmurs or gallops Breast:Examined in sitting and supine position were symmetrical in appearance, no palpable masses or tenderness,  no skin retraction, no nipple inversion, no nipple discharge, no skin discoloration, no axillary or supraclavicular lymphadenopathy Abdomen: no palpable masses or tenderness, no rebound or guarding Extremities: no edema or skin discoloration or tenderness  Pelvic: Vulva: Normal             Vagina: No gross lesions or discharge  Cervix: No gross lesions or discharge.  Pap reflex done  Uterus AV, normal size, shape and consistency, non-tender and mobile  Adnexa  Without masses or tenderness  Anus: Normal   Assessment/Plan:  64 y.o. female for annual exam   1. Encounter for routine gynecological examination with Papanicolaou smear of cervix Normal gynecologic exam in menopause.  Pap reflex done.  Breast exam normal.  Screening mammogram September 2020 was negative.  Colonoscopy in 2017.  Health labs with Dr. Elyse Hsu.  Referred to dermatologist for full skin exam.  2. Postmenopausal Well on no hormone replacement therapy.  No postmenopausal bleeding.  3. Screening for osteoporosis Vitamin D supplements, calcium intake of 1200 mg daily and regular weightbearing physical activities.  Will schedule a bone density here now. - DG Bone Density; Future  4. Overweight (BMI 25.0-29.9) Recommend a low calorie/carb diet such as [employer].  Aerobic physical activities 5 times a week and weightlifting every 2 days.  5. Type 2 diabetes mellitus without complication, without long-term current use of insulin (HCC) Back on a diabetic diet.  Increase physical activity and lower calories for weight loss.  Other orders - omeprazole  (PRILOSEC) 20 MG capsule; Take 20 mg by mouth daily. - Omega-3 Fatty Acids (FISH OIL PO); Take by mouth.  Genia Del MD, 9:28 AM 06/17/2019

## 2019-06-19 ENCOUNTER — Encounter: Payer: Self-pay | Admitting: Obstetrics & Gynecology

## 2019-06-19 NOTE — Patient Instructions (Signed)
1. Encounter for routine gynecological examination with Papanicolaou smear of cervix Normal gynecologic exam in menopause.  Pap reflex done.  Breast exam normal.  Screening mammogram September 2020 was negative.  Colonoscopy in 2017.  Health labs with Dr. Leslie Dales.  Referred to dermatologist for full skin exam.  2. Postmenopausal Well on no hormone replacement therapy.  No postmenopausal bleeding.  3. Screening for osteoporosis Vitamin D supplements, calcium intake of 1200 mg daily and regular weightbearing physical activities.  Will schedule a bone density here now. - DG Bone Density; Future  4. Overweight (BMI 25.0-29.9) Recommend a low calorie/carb diet such as [employer].  Aerobic physical activities 5 times a week and weightlifting every 2 days.  5. Type 2 diabetes mellitus without complication, without long-term current use of insulin (HCC) Back on a diabetic diet.  Increase physical activity and lower calories for weight loss.  Other orders - omeprazole (PRILOSEC) 20 MG capsule; Take 20 mg by mouth daily. - Omega-3 Fatty Acids (FISH OIL PO); Take by mouth.  Maria Malone, it was a pleasure seeing you today!  I will inform you of your results as soon as they are available.

## 2019-06-20 LAB — PAP IG W/ RFLX HPV ASCU

## 2019-06-21 ENCOUNTER — Telehealth: Payer: Self-pay | Admitting: *Deleted

## 2019-06-21 NOTE — Telephone Encounter (Signed)
-----   Message from Genia Del, MD sent at 06/19/2019  9:37 AM EST ----- Regarding: Refer to Dermatology Needs a full skin exam asap, never done.

## 2019-06-21 NOTE — Telephone Encounter (Signed)
Referral faxed to Dermatology specialist [phone] will call patient to schedule. Left detailed message on patient cell that referral has been faxed.

## 2019-07-08 NOTE — Telephone Encounter (Signed)
Scheduled on 08/03/19

## 2019-08-15 ENCOUNTER — Other Ambulatory Visit: Payer: Self-pay

## 2019-08-15 ENCOUNTER — Encounter (INDEPENDENT_AMBULATORY_CARE_PROVIDER_SITE_OTHER): Payer: BLUE CROSS/BLUE SHIELD | Admitting: Ophthalmology

## 2019-08-15 DIAGNOSIS — H338 Other retinal detachments: Secondary | ICD-10-CM

## 2019-08-15 DIAGNOSIS — H35341 Macular cyst, hole, or pseudohole, right eye: Secondary | ICD-10-CM

## 2019-08-15 DIAGNOSIS — H33303 Unspecified retinal break, bilateral: Secondary | ICD-10-CM | POA: Diagnosis not present

## 2019-08-15 DIAGNOSIS — H2512 Age-related nuclear cataract, left eye: Secondary | ICD-10-CM

## 2019-08-15 DIAGNOSIS — H43813 Vitreous degeneration, bilateral: Secondary | ICD-10-CM

## 2019-08-19 ENCOUNTER — Ambulatory Visit: Payer: Self-pay | Attending: Internal Medicine

## 2019-08-19 DIAGNOSIS — Z23 Encounter for immunization: Secondary | ICD-10-CM

## 2019-08-19 NOTE — Progress Notes (Signed)
   Covid-19 Vaccination Clinic  Name:  Maria Malone    MRN: [id number] DOB: 11-07-1955  08/19/2019  [name] was observed post Covid-19 immunization for 15 minutes without incident. She was provided with Vaccine Information Sheet and instruction to access the V-Safe system.   [name] was instructed to call 911 with any severe reactions post vaccine: Marland Kitchen Difficulty breathing  . Swelling of face and throat  . A fast heartbeat  . A bad rash all over body  . Dizziness and weakness   Immunizations Administered    Name Date Dose VIS Date Route   Pfizer COVID-19 Vaccine 08/19/2019  9:13 AM 0.3 mL 05/20/2019 Intramuscular   Manufacturer: [employer], [employer]   Lot: [id number]   NDC: [address]-8

## 2019-09-14 ENCOUNTER — Ambulatory Visit: Payer: Self-pay | Attending: Internal Medicine

## 2019-09-14 DIAGNOSIS — Z23 Encounter for immunization: Secondary | ICD-10-CM

## 2019-09-14 NOTE — Progress Notes (Signed)
   Covid-19 Vaccination Clinic  Name:  Jenika Chiem    MRN: [id number] DOB: [birth year]  09/14/2019  Ms. Pacer was observed post Covid-19 immunization for 15 minutes without incident. She was provided with Vaccine Information Sheet and instruction to access the V-Safe system.   Ms. Sakuma was instructed to call 911 with any severe reactions post vaccine: Marland Kitchen Difficulty breathing  . Swelling of face and throat  . A fast heartbeat  . A bad rash all over body  . Dizziness and weakness   Immunizations Administered    Name Date Dose VIS Date Route   Pfizer COVID-19 Vaccine 09/14/2019  8:16 AM 0.3 mL 05/20/2019 Intramuscular   Manufacturer: [employer], [employer]   Lot: [id number]   NDC: [address]-7

## 2020-01-19 ENCOUNTER — Other Ambulatory Visit: Payer: Self-pay | Admitting: Obstetrics & Gynecology

## 2020-01-19 DIAGNOSIS — Z1231 Encounter for screening mammogram for malignant neoplasm of breast: Secondary | ICD-10-CM

## 2020-02-20 ENCOUNTER — Other Ambulatory Visit: Payer: Self-pay

## 2020-02-20 ENCOUNTER — Ambulatory Visit
Admission: RE | Admit: 2020-02-20 | Discharge: 2020-02-20 | Disposition: A | Discharge status: Home / Self Care | Payer: 59 | Source: Ambulatory Visit | Attending: Obstetrics & Gynecology | Admitting: Obstetrics & Gynecology

## 2020-02-20 DIAGNOSIS — Z1231 Encounter for screening mammogram for malignant neoplasm of breast: Secondary | ICD-10-CM

## 2020-06-14 ENCOUNTER — Other Ambulatory Visit: Payer: 59

## 2020-06-20 ENCOUNTER — Other Ambulatory Visit: Payer: Self-pay

## 2020-06-20 ENCOUNTER — Encounter: Payer: Self-pay | Admitting: Obstetrics & Gynecology

## 2020-06-20 ENCOUNTER — Ambulatory Visit: Payer: 59 | Admitting: Obstetrics & Gynecology

## 2020-06-20 VITALS — BP 130/78 | Ht 65.0 in | Wt 174.0 lb

## 2020-06-20 DIAGNOSIS — Z13820 Encounter for screening for osteoporosis: Secondary | ICD-10-CM

## 2020-06-20 DIAGNOSIS — Z01419 Encounter for gynecological examination (general) (routine) without abnormal findings: Secondary | ICD-10-CM

## 2020-06-20 DIAGNOSIS — E663 Overweight: Secondary | ICD-10-CM | POA: Diagnosis not present

## 2020-06-20 DIAGNOSIS — Z780 Asymptomatic menopausal state: Secondary | ICD-10-CM

## 2020-06-20 NOTE — Progress Notes (Signed)
Maria Malone [birth year] [id number]   History:    65 y.o. G0 Single  KD:[id number] presenting for annual gyn exam   KNL:[id number] well on no HRT. No PMB. No pelvic pain. Abstinence. Urine/BMs normal. Breasts normal. Sister Dx Breast Cancer at age 73, now treated for mets to the lungs. BMI 28.96.  Walking her dog twice a day. DM Type 2 on Metformin. Exercising regularly, decreasing calories in nutrition. Health Labs with Endocrinologist.  Past medical history,surgical history, family history and social history were all reviewed and documented in the EPIC chart.  Gynecologic History No LMP recorded. Patient is postmenopausal.  Obstetric History OB History  Gravida Para Term Preterm AB Living  0            SAB IAB Ectopic Multiple Live Births                ROS: A ROS was performed and pertinent positives and negatives are included in the history.  GENERAL: No fevers or chills. HEENT: No change in vision, no earache, sore throat or sinus congestion. NECK: No pain or stiffness. CARDIOVASCULAR: No chest pain or pressure. No palpitations. PULMONARY: No shortness of breath, cough or wheeze. GASTROINTESTINAL: No abdominal pain, nausea, vomiting or diarrhea, melena or bright red blood per rectum. GENITOURINARY: No urinary frequency, urgency, hesitancy or dysuria. MUSCULOSKELETAL: No joint or muscle pain, no back pain, no recent trauma. DERMATOLOGIC: No rash, no itching, no lesions. ENDOCRINE: No polyuria, polydipsia, no heat or cold intolerance. No recent change in weight. HEMATOLOGICAL: No anemia or easy bruising or bleeding. NEUROLOGIC: No headache, seizures, numbness, tingling or weakness. PSYCHIATRIC: No depression, no loss of interest in normal activity or change in sleep pattern.     Exam:   BP 130/78   Ht 5\' 5"  (1.651 m)   Wt 174 lb (78.9 kg)   BMI 28.96 kg/m   Body mass index is 28.96 kg/m.  General appearance : Well developed well nourished  female. No acute distress HEENT: Eyes: no retinal hemorrhage or exudates,  Neck supple, trachea midline, no carotid bruits, no thyroidmegaly Lungs: Clear to auscultation, no rhonchi or wheezes, or rib retractions  Heart: Regular rate and rhythm, no murmurs or gallops Breast:Examined in sitting and supine position were symmetrical in appearance, no palpable masses or tenderness,  no skin retraction, no nipple inversion, no nipple discharge, no skin discoloration, no axillary or supraclavicular lymphadenopathy Abdomen: no palpable masses or tenderness, no rebound or guarding Extremities: no edema or skin discoloration or tenderness  Pelvic: Vulva: Normal             Vagina: No gross lesions or discharge  Cervix: No gross lesions or discharge  Uterus  AV, normal size, shape and consistency, non-tender and mobile  Adnexa  Without masses or tenderness  Anus: Normal   Assessment/Plan:  65 y.o. female for annual exam   1. Well female exam with routine gynecological exam Normal gynecologic exam in menopause.  No indication for Pap test this year, Pap test - January 2021.  Breast exam normal.  Screening mammogram September 2021 was negative.  We will schedule a colonoscopy this year with Dr. October 2021.  Health labs with family physician.  2. Postmenopausal Well on no hormone replacement therapy.  No postmenopausal bleeding.  3. Screening for osteoporosis We will schedule a bone density here now.  Vitamin D supplements, calcium intake of 1500 mg total daily recommended.  Continue with weightbearing physical activities daily.  4. Overweight (BMI 25.0-29.9) Recommend  a slightly lower calorie/carb diet.  Other orders - Multiple Vitamin (MULTIVITAMIN) tablet; Take 1 tablet by mouth daily.  Genia Del MD, 9:13 AM 06/20/2020

## 2020-06-28 ENCOUNTER — Other Ambulatory Visit: Payer: 59

## 2020-06-28 DIAGNOSIS — Z20822 Contact with and (suspected) exposure to covid-19: Secondary | ICD-10-CM

## 2020-06-30 LAB — NOVEL CORONAVIRUS, NAA: SARS-CoV-2, NAA: NOT DETECTED

## 2020-06-30 LAB — SARS-COV-2, NAA 2 DAY TAT

## 2020-08-15 ENCOUNTER — Other Ambulatory Visit: Payer: Self-pay

## 2020-08-15 ENCOUNTER — Encounter (INDEPENDENT_AMBULATORY_CARE_PROVIDER_SITE_OTHER): Payer: 59 | Admitting: Ophthalmology

## 2020-08-15 DIAGNOSIS — H33302 Unspecified retinal break, left eye: Secondary | ICD-10-CM

## 2020-08-15 DIAGNOSIS — H35341 Macular cyst, hole, or pseudohole, right eye: Secondary | ICD-10-CM | POA: Diagnosis not present

## 2020-08-15 DIAGNOSIS — H338 Other retinal detachments: Secondary | ICD-10-CM | POA: Diagnosis not present

## 2020-08-15 DIAGNOSIS — H43813 Vitreous degeneration, bilateral: Secondary | ICD-10-CM | POA: Diagnosis not present

## 2020-09-25 ENCOUNTER — Other Ambulatory Visit: Payer: Self-pay

## 2020-09-25 ENCOUNTER — Other Ambulatory Visit: Payer: Self-pay | Admitting: Obstetrics & Gynecology

## 2020-09-25 ENCOUNTER — Ambulatory Visit (INDEPENDENT_AMBULATORY_CARE_PROVIDER_SITE_OTHER): Payer: 59

## 2020-09-25 DIAGNOSIS — M85851 Other specified disorders of bone density and structure, right thigh: Secondary | ICD-10-CM

## 2020-09-25 DIAGNOSIS — Z780 Asymptomatic menopausal state: Secondary | ICD-10-CM

## 2020-09-25 DIAGNOSIS — Z13820 Encounter for screening for osteoporosis: Secondary | ICD-10-CM

## 2021-03-06 ENCOUNTER — Other Ambulatory Visit: Payer: Self-pay | Admitting: Obstetrics & Gynecology

## 2021-03-06 DIAGNOSIS — Z1231 Encounter for screening mammogram for malignant neoplasm of breast: Secondary | ICD-10-CM

## 2021-04-05 ENCOUNTER — Ambulatory Visit
Admission: RE | Admit: 2021-04-05 | Discharge: 2021-04-05 | Disposition: A | Discharge status: Home / Self Care | Payer: 59 | Source: Ambulatory Visit | Attending: Obstetrics & Gynecology | Admitting: Obstetrics & Gynecology

## 2021-04-05 ENCOUNTER — Other Ambulatory Visit: Payer: Self-pay

## 2021-04-05 DIAGNOSIS — Z1231 Encounter for screening mammogram for malignant neoplasm of breast: Secondary | ICD-10-CM

## 2021-06-13 IMAGING — MG DIGITAL SCREENING BILAT W/ TOMO W/ CAD
6 of 10 series · 6 of 30 positions shown · non-contrast
Comparison: Previous exam(s).

CLINICAL DATA: Screening.

EXAM:
DIGITAL SCREENING BILATERAL MAMMOGRAM WITH TOMO AND CAD

[L CC synth-2D]
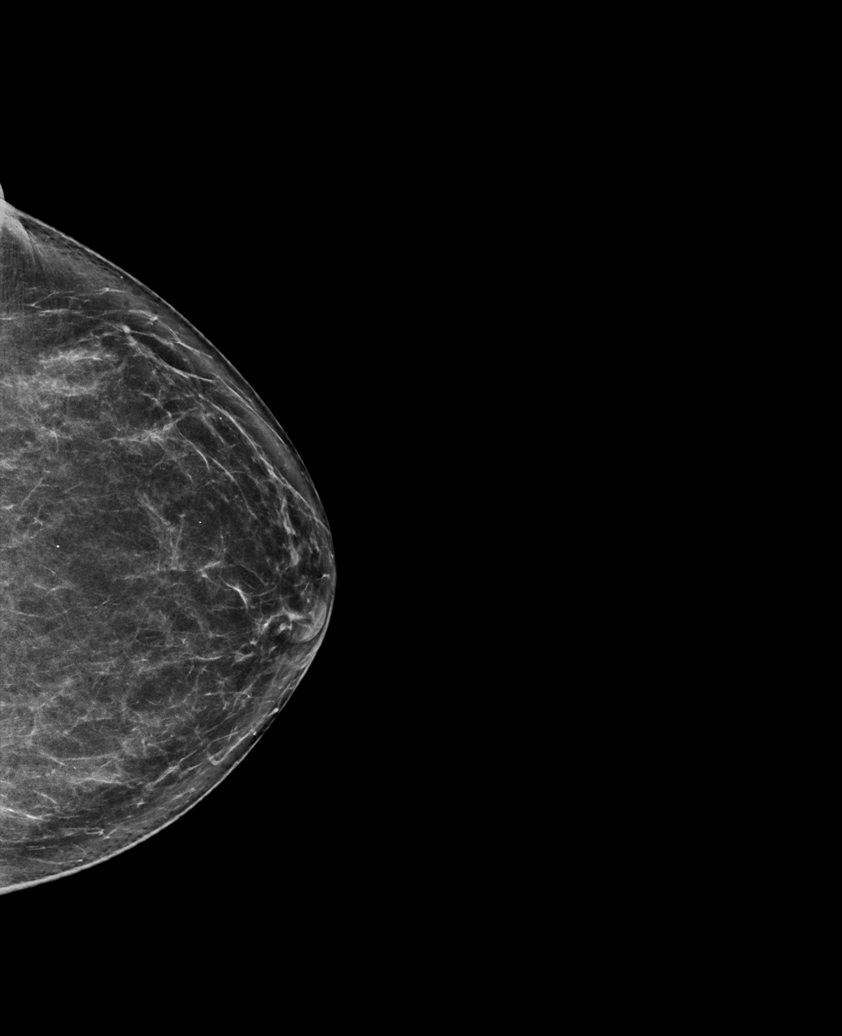

[R MLO synth-2D (1 of 2)]
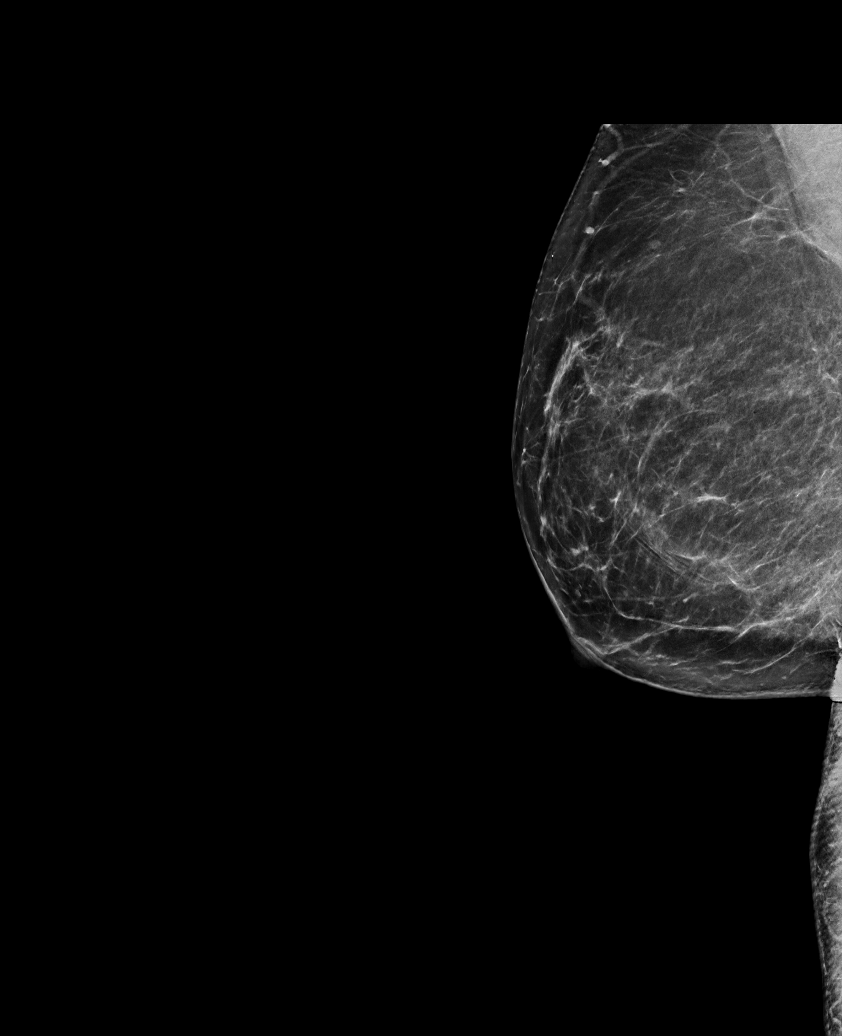

[R MLO synth-2D (2 of 2)]
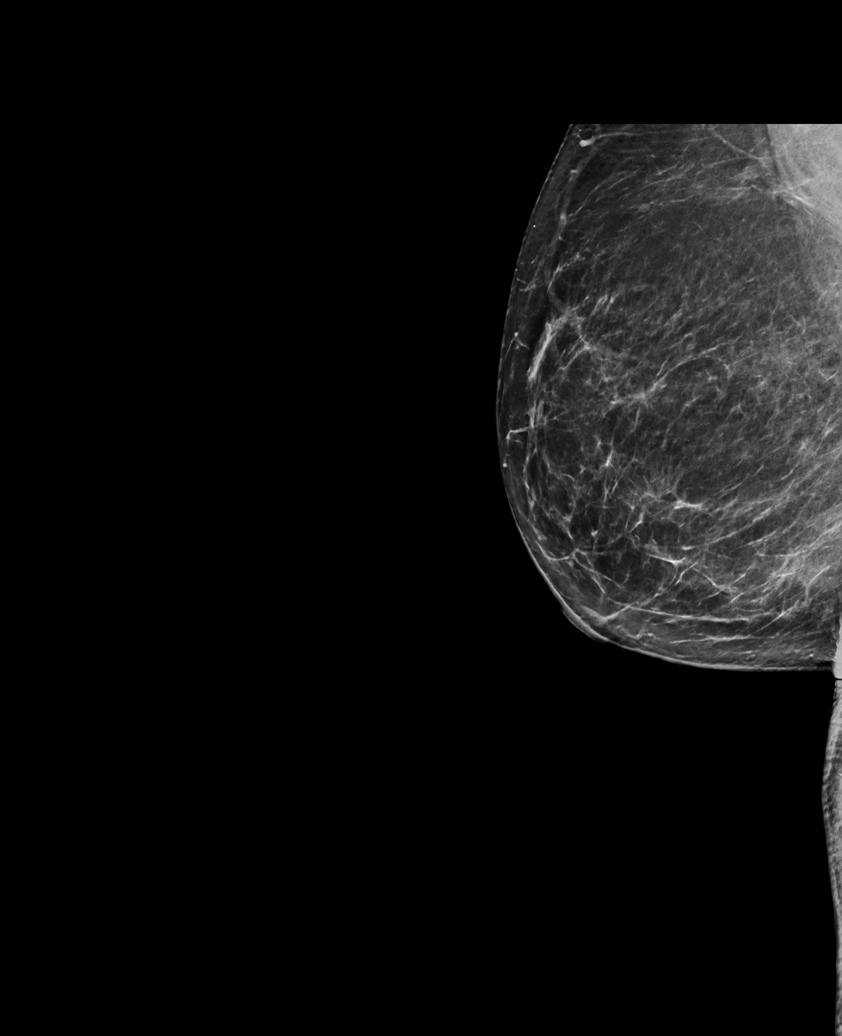

[L MLO synth-2D]
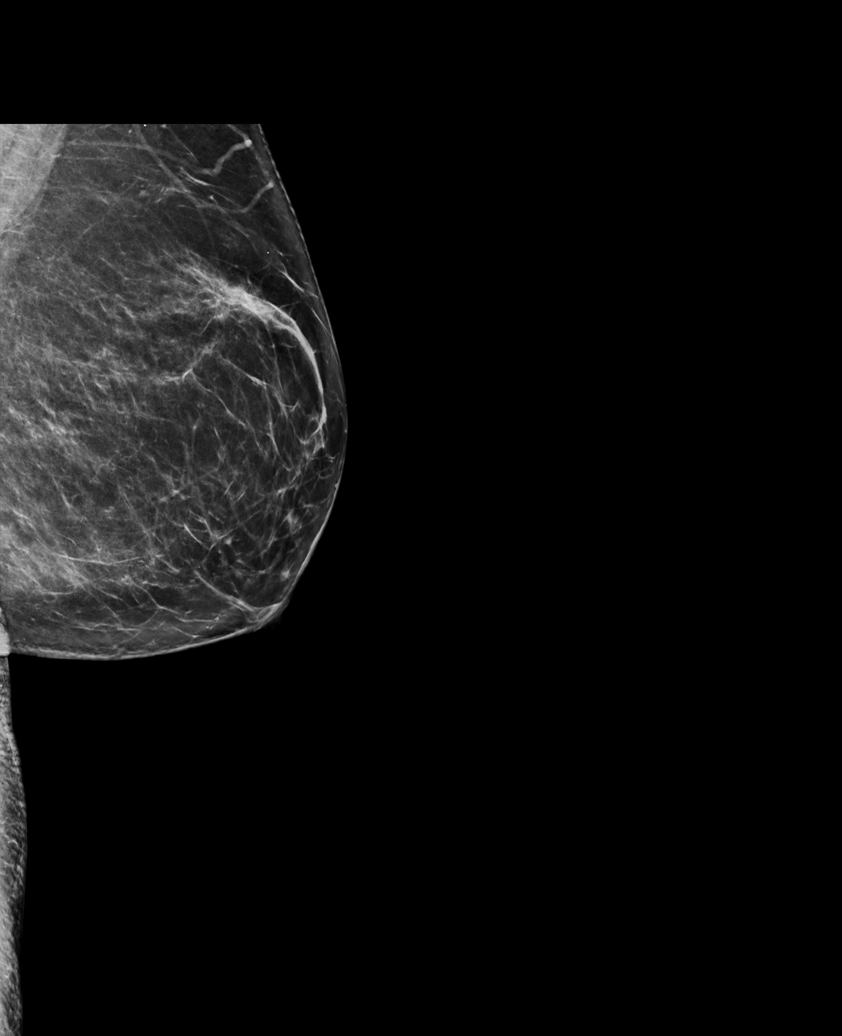

[R CC synth-2D]
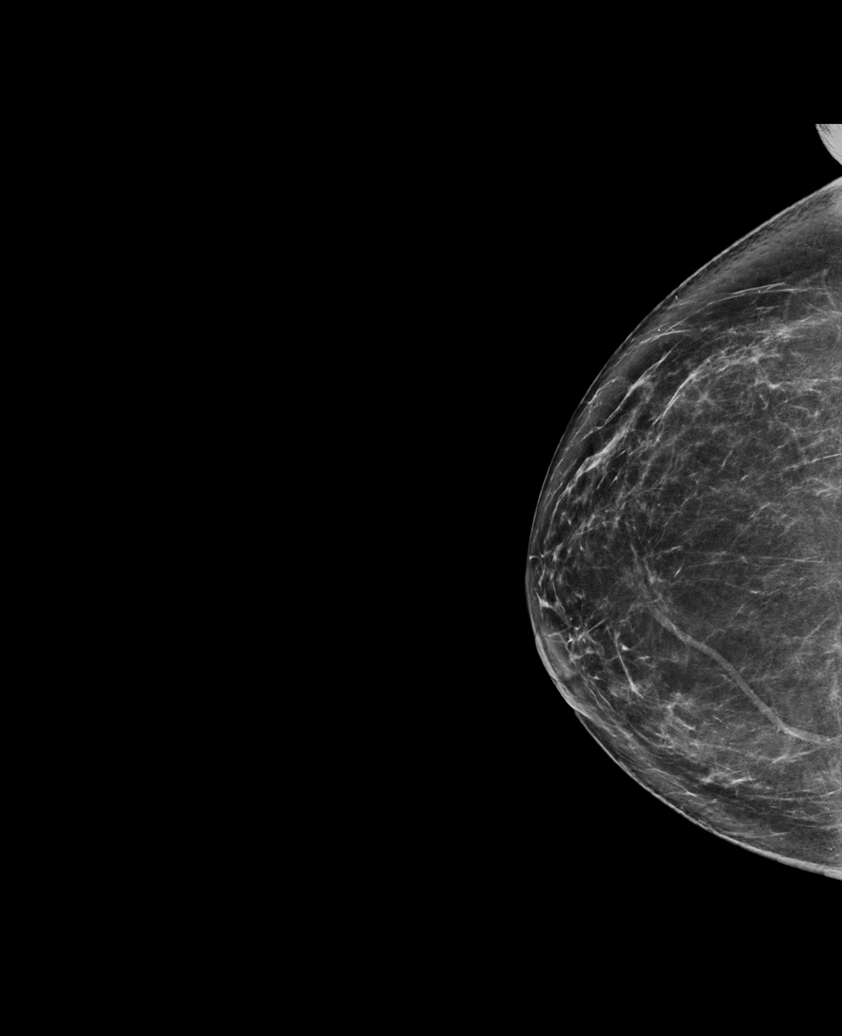

[R MLO tomo · tomo slice 39/76.0]
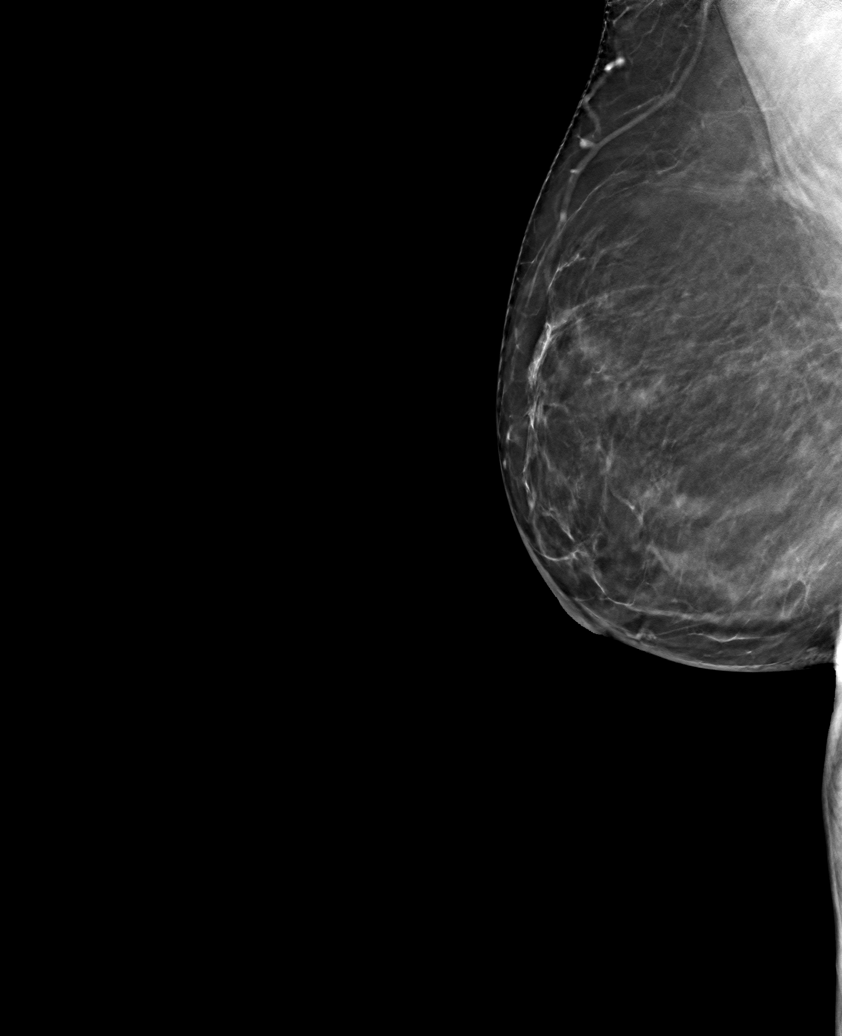

[6 of 30 positions shown; findings below may reference images not displayed]

ACR Breast Density Category b: There are scattered areas of
fibroglandular density.
FINDINGS: There are no findings suspicious for malignancy. Images were
processed with CAD.
IMPRESSION: No mammographic evidence of malignancy. A result letter of this
screening mammogram will be mailed directly to the patient.

RECOMMENDATION:
Screening mammogram in one year. (Code:[id number])

BI-RADS CATEGORY  1: Negative.

## 2021-06-21 ENCOUNTER — Encounter: Payer: Self-pay | Admitting: Obstetrics & Gynecology

## 2021-06-21 ENCOUNTER — Other Ambulatory Visit: Payer: Self-pay

## 2021-06-21 ENCOUNTER — Ambulatory Visit (INDEPENDENT_AMBULATORY_CARE_PROVIDER_SITE_OTHER): Payer: 59 | Admitting: Obstetrics & Gynecology

## 2021-06-21 VITALS — BP 130/70 | Ht 65.0 in | Wt 164.0 lb

## 2021-06-21 DIAGNOSIS — Z01419 Encounter for gynecological examination (general) (routine) without abnormal findings: Secondary | ICD-10-CM | POA: Diagnosis not present

## 2021-06-21 DIAGNOSIS — Z780 Asymptomatic menopausal state: Secondary | ICD-10-CM

## 2021-06-21 DIAGNOSIS — E663 Overweight: Secondary | ICD-10-CM

## 2021-06-21 DIAGNOSIS — M85851 Other specified disorders of bone density and structure, right thigh: Secondary | ICD-10-CM

## 2021-06-21 NOTE — Progress Notes (Signed)
Maria Malone Jul 14, 1955 [id number]   History:    67 y.o.  G0 Single   RP:  Established patient presenting for annual gyn exam    HPI: Postmenopausal well on no HRT.  No PMB.  No pelvic pain.  Abstinence. Pap 06/2019 Neg. Urine/BMs normal.  Breasts normal.  Mammo Neg 03/2021. Sister Dx Breast Cancer at age 39, now treated for mets to the lungs.  BMI 27.29.  Walking her dog twice a day.  DM Type 2 on Insulin, Mounjaro and Metformin.  Exercising regularly, decreasing calories in nutrition.  Health Labs with Endocrinologist.  Last Colono 05/2016, planning repeat Colono this year.  BD 09/2020 Osteopenia Rt Fem Neck T-Score -1.4, all other sites normal.   Past medical history,surgical history, family history and social history were all reviewed and documented in the EPIC chart.  Gynecologic History No LMP recorded. Patient is postmenopausal.  Obstetric History OB History  Gravida Para Term Preterm AB Living  0            SAB IAB Ectopic Multiple Live Births                ROS: A ROS was performed and pertinent positives and negatives are included in the history.  GENERAL: No fevers or chills. HEENT: No change in vision, no earache, sore throat or sinus congestion. NECK: No pain or stiffness. CARDIOVASCULAR: No chest pain or pressure. No palpitations. PULMONARY: No shortness of breath, cough or wheeze. GASTROINTESTINAL: No abdominal pain, nausea, vomiting or diarrhea, melena or bright red blood per rectum. GENITOURINARY: No urinary frequency, urgency, hesitancy or dysuria. MUSCULOSKELETAL: No joint or muscle pain, no back pain, no recent trauma. DERMATOLOGIC: No rash, no itching, no lesions. ENDOCRINE: No polyuria, polydipsia, no heat or cold intolerance. No recent change in weight. HEMATOLOGICAL: No anemia or easy bruising or bleeding. NEUROLOGIC: No headache, seizures, numbness, tingling or weakness. PSYCHIATRIC: No depression, no loss of interest in normal activity or change in sleep  pattern.     Exam:   BP 130/70    Ht 5\' 5"  (1.651 m)    Wt 164 lb (74.4 kg)    BMI 27.29 kg/m   Body mass index is 27.29 kg/m.  General appearance : Well developed well nourished female. No acute distress HEENT: Eyes: no retinal hemorrhage or exudates,  Neck supple, trachea midline, no carotid bruits, no thyroidmegaly Lungs: Clear to auscultation, no rhonchi or wheezes, or rib retractions  Heart: Regular rate and rhythm, no murmurs or gallops Breast:Examined in sitting and supine position were symmetrical in appearance, no palpable masses or tenderness,  no skin retraction, no nipple inversion, no nipple discharge, no skin discoloration, no axillary or supraclavicular lymphadenopathy Abdomen: no palpable masses or tenderness, no rebound or guarding Extremities: no edema or skin discoloration or tenderness  Pelvic: Vulva: Normal             Vagina: No gross lesions or discharge  Cervix: No gross lesions or discharge  Uterus  AV, normal size, shape and consistency, non-tender and mobile  Adnexa  Without masses or tenderness  Anus: Normal   Assessment/Plan:  66 y.o. female for annual exam   1. Well female exam with routine gynecological exam Postmenopausal well on no HRT.  No PMB.  No pelvic pain.  Abstinence. Pap 06/2019 Neg. Urine/BMs normal.  Breasts normal.  Mammo Neg 03/2021. Sister Dx Breast Cancer at age 26, now treated for mets to the lungs.  BMI 27.29.  Walking her  dog twice a day.  DM Type 2 on Insulin, Mounjaro and Metformin.  Exercising regularly, decreasing calories in nutrition.  Health Labs with Endocrinologist.  Last Colono 05/2016, planning repeat Colono this year.  BD 09/2020 Osteopenia Rt Fem Neck T-Score -1.4, all other sites normal.  2. Postmenopausal Postmenopausal well on no HRT.  No PMB.  No pelvic pain.  Abstinence.  3. Osteopenia of neck of right femur  BD 09/2020 Osteopenia Rt Fem Neck T-Score -1.4, all other sites normal. Vit D supplement, Ca++ 1.5 g/d  total, continue regular weight bearing physical activities.  4. Overweight (BMI 25.0-29.9) Low calorie/DM diet.  Continue fitness activities.  Other orders - insulin glargine (LANTUS SOLOSTAR) 100 UNIT/ML Solostar Pen; Inject into the skin. - tirzepatide [facility]) 2.5 MG/0.5ML Pen; Inject 2.5 mg into the skin once a week.   Genia Del MD, 9:22 AM 06/21/2021

## 2021-08-19 ENCOUNTER — Encounter (INDEPENDENT_AMBULATORY_CARE_PROVIDER_SITE_OTHER): Payer: 59 | Admitting: Ophthalmology

## 2021-08-19 ENCOUNTER — Other Ambulatory Visit: Payer: Self-pay

## 2021-08-19 DIAGNOSIS — H35341 Macular cyst, hole, or pseudohole, right eye: Secondary | ICD-10-CM

## 2021-08-19 DIAGNOSIS — H43813 Vitreous degeneration, bilateral: Secondary | ICD-10-CM | POA: Diagnosis not present

## 2021-08-19 DIAGNOSIS — H338 Other retinal detachments: Secondary | ICD-10-CM | POA: Diagnosis not present

## 2021-08-19 DIAGNOSIS — H33302 Unspecified retinal break, left eye: Secondary | ICD-10-CM | POA: Diagnosis not present

## 2022-03-03 ENCOUNTER — Other Ambulatory Visit: Payer: Self-pay | Admitting: Internal Medicine

## 2022-03-03 DIAGNOSIS — Z1231 Encounter for screening mammogram for malignant neoplasm of breast: Secondary | ICD-10-CM

## 2022-04-07 ENCOUNTER — Ambulatory Visit
Admission: RE | Admit: 2022-04-07 | Discharge: 2022-04-07 | Disposition: A | Discharge status: Home / Self Care | Payer: Medicare Other | Source: Ambulatory Visit | Attending: Internal Medicine | Admitting: Internal Medicine

## 2022-04-07 DIAGNOSIS — Z1231 Encounter for screening mammogram for malignant neoplasm of breast: Secondary | ICD-10-CM

## 2022-07-02 ENCOUNTER — Ambulatory Visit (INDEPENDENT_AMBULATORY_CARE_PROVIDER_SITE_OTHER): Payer: Medicare Other | Admitting: Obstetrics & Gynecology

## 2022-07-02 ENCOUNTER — Encounter: Payer: Self-pay | Admitting: Obstetrics & Gynecology

## 2022-07-02 VITALS — BP 118/68 | HR 100 | Ht 64.75 in | Wt 162.0 lb

## 2022-07-02 DIAGNOSIS — Z780 Asymptomatic menopausal state: Secondary | ICD-10-CM

## 2022-07-02 DIAGNOSIS — Z01419 Encounter for gynecological examination (general) (routine) without abnormal findings: Secondary | ICD-10-CM

## 2022-07-02 DIAGNOSIS — M85851 Other specified disorders of bone density and structure, right thigh: Secondary | ICD-10-CM

## 2022-07-02 NOTE — Progress Notes (Signed)
Maria Malone Deem 1956-03-20 [id number]   History:    67 y.o.  G0 Single   RP:  Established patient presenting for annual gyn exam    HPI: Postmenopausal well on no HRT.  No PMB.  No pelvic pain.  Abstinence. Pap 06/2019 Neg. No h/o abnormal Pap.  Will repeat Pap at 5 years. Urine/BMs normal.  Breasts normal.  Mammo Neg 03/2022. Sister Dx Breast Cancer at age 68, now treated for mets to the lungs.  BMI 27.17.  Walking her dog twice a day.  DM Type 2 on Insulin, Mounjaro and Metformin.  Exercising regularly, decreasing calories in nutrition.  Health Labs with Endocrinologist.  Last Colono 05/2016, planning repeat Colono this year.  BD 09/2020 Osteopenia Rt Fem Neck T-Score -1.4, all other sites normal. Repeat BD at 3 yrs.  Flu vaccine at pharmacy.  Past medical history,surgical history, family history and social history were all reviewed and documented in the EPIC chart.  Gynecologic History No LMP recorded. Patient is postmenopausal.  Obstetric History OB History  Gravida Para Term Preterm AB Living  0            SAB IAB Ectopic Multiple Live Births                ROS: A ROS was performed and pertinent positives and negatives are included in the history. GENERAL: No fevers or chills. HEENT: No change in vision, no earache, sore throat or sinus congestion. NECK: No pain or stiffness. CARDIOVASCULAR: No chest pain or pressure. No palpitations. PULMONARY: No shortness of breath, cough or wheeze. GASTROINTESTINAL: No abdominal pain, nausea, vomiting or diarrhea, melena or bright red blood per rectum. GENITOURINARY: No urinary frequency, urgency, hesitancy or dysuria. MUSCULOSKELETAL: No joint or muscle pain, no back pain, no recent trauma. DERMATOLOGIC: No rash, no itching, no lesions. ENDOCRINE: No polyuria, polydipsia, no heat or cold intolerance. No recent change in weight. HEMATOLOGICAL: No anemia or easy bruising or bleeding. NEUROLOGIC: No headache, seizures, numbness, tingling or weakness.  PSYCHIATRIC: No depression, no loss of interest in normal activity or change in sleep pattern.     Exam:   BP 118/68   Pulse 100   Ht 5' 4.75" (1.645 m)   Wt 162 lb (73.5 kg)   SpO2 98%   BMI 27.17 kg/m   Body mass index is 27.17 kg/m.  General appearance : Well developed well nourished female. No acute distress HEENT: Eyes: no retinal hemorrhage or exudates,  Neck supple, trachea midline, no carotid bruits, no thyroidmegaly Lungs: Clear to auscultation, no rhonchi or wheezes, or rib retractions  Heart: Regular rate and rhythm, no murmurs or gallops Breast:Examined in sitting and supine position were symmetrical in appearance, no palpable masses or tenderness,  no skin retraction, no nipple inversion, no nipple discharge, no skin discoloration, no axillary or supraclavicular lymphadenopathy Abdomen: no palpable masses or tenderness, no rebound or guarding Extremities: no edema or skin discoloration or tenderness  Pelvic: Vulva: Normal             Vagina: No gross lesions or discharge  Cervix: No gross lesions or discharge  Uterus  AV, normal size, shape and consistency, non-tender and mobile  Adnexa  Without masses or tenderness  Anus: Normal   Assessment/Plan:  67 y.o. female for annual exam   1. Well female exam with routine gynecological exam Postmenopausal well on no HRT.  No PMB.  No pelvic pain.  Abstinence. Pap 06/2019 Neg. No h/o abnormal Pap.  Will  repeat Pap at 5 years. Urine/BMs normal. Breasts normal.  Mammo Neg 03/2022. Sister Dx Breast Cancer at age 53, now treated for mets to the lungs. BMI 27.17.  Walking her dog twice a day.  DM Type 2 on Insulin, Mounjaro and Metformin.  Exercising regularly, decreasing calories in nutrition.  Health Labs with Endocrinologist.  Last Colono 05/2016, planning repeat Colono this year.  BD 09/2020 Osteopenia Rt Fem Neck T-Score -1.4, all other sites normal. Repeat BD at 3 yrs.  Flu vaccine at pharmacy.  2.  Postmenopausal Postmenopausal well on no HRT.  No PMB.  No pelvic pain.  Abstinence.   3. Osteopenia of neck of right femur BD 09/2020 Osteopenia Rt Fem Neck T-Score -1.4, all other sites normal. Repeat BD at 3 yrs. Continue walking the dog twice a day.  Other orders - Blood Glucose Monitoring Suppl (ACCU-CHEK GUIDE) w/Device KIT; USE 1 EACH DAILY - MOUNJARO 5 MG/0.5ML Pen; SMARTSIG:5 Milligram(s) SUB-Q Once a Week - VITAMIN D PO; Take by mouth.   Princess Bruins MD, 9:17 AM

## 2022-08-25 ENCOUNTER — Encounter (INDEPENDENT_AMBULATORY_CARE_PROVIDER_SITE_OTHER): Payer: Medicare Other | Admitting: Ophthalmology

## 2022-08-25 DIAGNOSIS — H33302 Unspecified retinal break, left eye: Secondary | ICD-10-CM

## 2022-08-25 DIAGNOSIS — H43813 Vitreous degeneration, bilateral: Secondary | ICD-10-CM | POA: Diagnosis not present

## 2022-08-25 DIAGNOSIS — H35341 Macular cyst, hole, or pseudohole, right eye: Secondary | ICD-10-CM | POA: Diagnosis not present

## 2022-08-25 DIAGNOSIS — H338 Other retinal detachments: Secondary | ICD-10-CM | POA: Diagnosis not present

## 2022-08-25 DIAGNOSIS — H2512 Age-related nuclear cataract, left eye: Secondary | ICD-10-CM

## 2023-01-02 NOTE — Progress Notes (Signed)
Anesthesia Review:  PCP: Cardiologist : Endocrinologist- LOV 01/02/23 Maria Malone  Chest x-ray : EKG : Echo : Stress test: Cardiac Cath :  Activity level:  Sleep Study/ CPAP : Fasting Blood Sugar :      / Checks Blood Sugar -- times a day:   Blood Thinner/ Instructions /Last Dose: ASA / Instructions/ Last Dose :    Hgba1c and CMP done 01/01/23 - 6.7

## 2023-01-05 ENCOUNTER — Other Ambulatory Visit: Payer: Self-pay

## 2023-01-05 ENCOUNTER — Inpatient Hospital Stay (HOSPITAL_COMMUNITY)
Admission: RE | Admit: 2023-01-05 | Discharge: 2023-01-05 | Disposition: A | Discharge status: Home / Self Care | Payer: Medicare Other | Source: Ambulatory Visit

## 2023-01-05 ENCOUNTER — Encounter (HOSPITAL_COMMUNITY): Payer: Self-pay

## 2023-01-05 HISTORY — DX: Other specified postprocedural states: Z98.890

## 2023-01-05 HISTORY — DX: Nausea with vomiting, unspecified: R11.2

## 2023-01-05 HISTORY — DX: Unspecified osteoarthritis, unspecified site: M19.90

## 2023-01-05 HISTORY — DX: Gastro-esophageal reflux disease without esophagitis: K21.9

## 2023-01-07 NOTE — Anesthesia Preprocedure Evaluation (Signed)
Anesthesia Evaluation  Patient identified by MRN, date of birth, ID band Patient awake    Reviewed: Allergy & Precautions, NPO status , Patient's Chart, lab work & pertinent test results  History of Anesthesia Complications (+) PONV and history of anesthetic complications  Airway Mallampati: II  TM Distance: >3 FB Neck ROM: Full    Dental  (+) Teeth Intact, Dental Advisory Given   Pulmonary neg pulmonary ROS   Pulmonary exam normal breath sounds clear to auscultation       Cardiovascular negative cardio ROS Normal cardiovascular exam Rhythm:Regular Rate:Normal     Neuro/Psych negative neurological ROS     GI/Hepatic Neg liver ROS,GERD  Medicated,,  Endo/Other  diabetes, Type 2, Oral Hypoglycemic Agents    Renal/GU negative Renal ROS     Musculoskeletal  (+) Arthritis ,    Abdominal   Peds  Hematology negative hematology ROS (+)   Anesthesia Other Findings   Reproductive/Obstetrics                             Anesthesia Physical Anesthesia Plan  ASA: 2  Anesthesia Plan: MAC   Post-op Pain Management: Tylenol PO (pre-op)* and Regional block*   Induction: Intravenous  PONV Risk Score and Plan: 3 and Midazolam and TIVA  Airway Management Planned: Natural Airway and Mask  Additional Equipment:   Intra-op Plan:   Post-operative Plan:   Informed Consent: I have reviewed the patients History and Physical, chart, labs and discussed the procedure including the risks, benefits and alternatives for the proposed anesthesia with the patient or authorized representative who has indicated his/her understanding and acceptance.     Dental advisory given  Plan Discussed with: CRNA  Anesthesia Plan Comments:        Anesthesia Quick Evaluation

## 2023-01-08 ENCOUNTER — Ambulatory Visit (HOSPITAL_COMMUNITY): Payer: Medicare Other | Admitting: Physician Assistant

## 2023-01-08 ENCOUNTER — Encounter (HOSPITAL_COMMUNITY)
Admission: RE | Disposition: A | Discharge status: Home / Self Care | Payer: Self-pay | Source: Home / Self Care | Attending: Orthopedic Surgery

## 2023-01-08 ENCOUNTER — Ambulatory Visit (HOSPITAL_COMMUNITY)
Admission: RE | Admit: 2023-01-08 | Discharge: 2023-01-08 | Disposition: A | Discharge status: Home / Self Care | Payer: Medicare Other | Attending: Orthopedic Surgery | Admitting: Orthopedic Surgery

## 2023-01-08 ENCOUNTER — Encounter (HOSPITAL_COMMUNITY): Payer: Self-pay | Admitting: Orthopedic Surgery

## 2023-01-08 ENCOUNTER — Ambulatory Visit (HOSPITAL_BASED_OUTPATIENT_CLINIC_OR_DEPARTMENT_OTHER): Payer: Medicare Other | Admitting: Anesthesiology

## 2023-01-08 DIAGNOSIS — Z96651 Presence of right artificial knee joint: Secondary | ICD-10-CM | POA: Insufficient documentation

## 2023-01-08 DIAGNOSIS — M1990 Unspecified osteoarthritis, unspecified site: Secondary | ICD-10-CM | POA: Insufficient documentation

## 2023-01-08 DIAGNOSIS — M1711 Unilateral primary osteoarthritis, right knee: Secondary | ICD-10-CM | POA: Diagnosis not present

## 2023-01-08 DIAGNOSIS — Z833 Family history of diabetes mellitus: Secondary | ICD-10-CM | POA: Diagnosis not present

## 2023-01-08 DIAGNOSIS — M24661 Ankylosis, right knee: Secondary | ICD-10-CM | POA: Insufficient documentation

## 2023-01-08 DIAGNOSIS — E119 Type 2 diabetes mellitus without complications: Secondary | ICD-10-CM | POA: Insufficient documentation

## 2023-01-08 DIAGNOSIS — Z01818 Encounter for other preprocedural examination: Secondary | ICD-10-CM

## 2023-01-08 HISTORY — PX: CLOSED MANIPULATION KNEE WITH STERIOD INJECTION: SHX5610

## 2023-01-08 LAB — GLUCOSE, CAPILLARY: Glucose-Capillary: 177 mg/dL — ABNORMAL HIGH (ref 70–99)

## 2023-01-08 SURGERY — CLOSED MANIPULATION KNEE WITH STEROID INJECTION
Anesthesia: Monitor Anesthesia Care | Site: Knee | Laterality: Right

## 2023-01-08 MED ORDER — FENTANYL CITRATE PF 50 MCG/ML IJ SOSY
PREFILLED_SYRINGE | INTRAMUSCULAR | Status: AC
  Administered 2023-01-08: 50 ug via INTRAVENOUS
  Filled 2023-01-08: qty 2

## 2023-01-08 MED ORDER — LACTATED RINGERS IV SOLN: INTRAVENOUS | Status: DC

## 2023-01-08 MED ORDER — FENTANYL CITRATE PF 50 MCG/ML IJ SOSY
25.0000 ug | PREFILLED_SYRINGE | INTRAMUSCULAR | Status: DC | PRN
  Administered 2023-01-08: 50 ug via INTRAVENOUS

## 2023-01-08 MED ORDER — ORAL CARE MOUTH RINSE: 15.0000 mL | Freq: Once | OROMUCOSAL | Status: AC

## 2023-01-08 MED ORDER — OXYCODONE HCL 5 MG PO TABS
5.0000 mg | ORAL_TABLET | ORAL | Status: DC | PRN
  Administered 2023-01-08: 5 mg via ORAL

## 2023-01-08 MED ORDER — MIDAZOLAM HCL 2 MG/2ML IJ SOLN: 1.0000 mg | INTRAMUSCULAR | Status: DC

## 2023-01-08 MED ORDER — BUPIVACAINE-EPINEPHRINE (PF) 0.5% -1:200000 IJ SOLN
INTRAMUSCULAR | Status: DC | PRN
  Administered 2023-01-08: 30 mL via PERINEURAL

## 2023-01-08 MED ORDER — FENTANYL CITRATE PF 50 MCG/ML IJ SOSY
PREFILLED_SYRINGE | INTRAMUSCULAR | Status: AC
  Filled 2023-01-08: qty 1

## 2023-01-08 MED ORDER — METHOCARBAMOL 500 MG PO TABS: 500.0000 mg | ORAL_TABLET | Freq: Four times a day (QID) | ORAL | Status: DC | PRN

## 2023-01-08 MED ORDER — OXYCODONE HCL 5 MG PO TABS
ORAL_TABLET | ORAL | Status: AC
  Filled 2023-01-08: qty 1

## 2023-01-08 MED ORDER — PROPOFOL 10 MG/ML IV BOLUS
INTRAVENOUS | Status: DC | PRN
  Administered 2023-01-08 (×6): 50 mg via INTRAVENOUS

## 2023-01-08 MED ORDER — LIDOCAINE HCL (CARDIAC) PF 100 MG/5ML IV SOSY
PREFILLED_SYRINGE | INTRAVENOUS | Status: DC | PRN
  Administered 2023-01-08: 80 mg via INTRATRACHEAL

## 2023-01-08 MED ORDER — ACETAMINOPHEN 500 MG PO TABS
1000.0000 mg | ORAL_TABLET | Freq: Once | ORAL | Status: AC
  Administered 2023-01-08: 1000 mg via ORAL
  Filled 2023-01-08: qty 2

## 2023-01-08 MED ORDER — FENTANYL CITRATE PF 50 MCG/ML IJ SOSY: 50.0000 ug | PREFILLED_SYRINGE | INTRAMUSCULAR | Status: AC

## 2023-01-08 MED ORDER — DEXAMETHASONE SODIUM PHOSPHATE 10 MG/ML IJ SOLN
INTRAMUSCULAR | Status: DC | PRN
Start: 2023-01-08 — End: 2023-01-08
  Administered 2023-01-08: 10 mg

## 2023-01-08 MED ORDER — METHOCARBAMOL 500 MG IVPB - SIMPLE MED
500.0000 mg | Freq: Four times a day (QID) | INTRAVENOUS | Status: DC | PRN
  Administered 2023-01-08: 500 mg via INTRAVENOUS

## 2023-01-08 MED ORDER — ONDANSETRON HCL 4 MG/2ML IJ SOLN: 4.0000 mg | Freq: Once | INTRAMUSCULAR | Status: DC | PRN

## 2023-01-08 MED ORDER — MIDAZOLAM HCL 2 MG/2ML IJ SOLN
INTRAMUSCULAR | Status: AC
  Administered 2023-01-08: 2 mg via INTRAVENOUS
  Filled 2023-01-08: qty 2

## 2023-01-08 MED ORDER — METHOCARBAMOL 500 MG IVPB - SIMPLE MED
INTRAVENOUS | Status: AC
  Filled 2023-01-08: qty 55

## 2023-01-08 MED ORDER — LACTATED RINGERS IV BOLUS
500.0000 mL | Freq: Once | INTRAVENOUS | Status: AC
  Administered 2023-01-08: 500 mL via INTRAVENOUS

## 2023-01-08 MED ORDER — CHLORHEXIDINE GLUCONATE 0.12 % MT SOLN
15.0000 mL | Freq: Once | OROMUCOSAL | Status: AC
  Administered 2023-01-08: 15 mL via OROMUCOSAL

## 2023-01-08 SURGICAL SUPPLY — 9 items
BNDG ADH 1X3 SHEER STRL LF (GAUZE/BANDAGES/DRESSINGS) ×1 IMPLANT
BNDG ADH THN 3X1 STRL LF (GAUZE/BANDAGES/DRESSINGS)
KIT TURNOVER KIT A (KITS) ×1 IMPLANT
NDL HYPO 22X1.5 SAFETY MO (MISCELLANEOUS) ×1 IMPLANT
NDL SAFETY ECLIP 18X1.5 (MISCELLANEOUS) ×1 IMPLANT
NEEDLE HYPO 22X1.5 SAFETY MO (MISCELLANEOUS) IMPLANT
PAD ALCOHOL SWAB (MISCELLANEOUS) ×3 IMPLANT
SWABSTICK PVP IODINE (MISCELLANEOUS) ×1 IMPLANT
SYR CONTROL 10ML LL (SYRINGE) ×1 IMPLANT

## 2023-01-08 NOTE — Brief Op Note (Signed)
01/08/2023  10:11 AM  PATIENT:  Maria Malone  67 y.o. 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   Delay start of Pharmacological VTE agent (>24hrs) due to surgical blood loss or risk of bleeding: not applicable

## 2023-01-08 NOTE — Anesthesia Procedure Notes (Signed)
Anesthesia Regional Block: Adductor canal block   Pre-Anesthetic Checklist: , timeout performed,  Correct Patient, Correct Site, Correct Laterality,  Correct Procedure, Correct Position, site marked,  Risks and benefits discussed,  Surgical consent,  Pre-op evaluation,  At surgeon's request and post-op pain management  Laterality: Right  Prep: chloraprep       Needles:  Injection technique: Single-shot  Needle Type: Echogenic Needle     Needle Length: 9cm  Needle Gauge: 21     Additional Needles:   Procedures:,,,, ultrasound used (permanent image in chart),,    Narrative:  Start time: 01/08/2023 8:22 AM End time: 01/08/2023 8:30 AM Injection made incrementally with aspirations every 5 mL.  Performed by: Personally  Anesthesiologist: Collene Schlichter, MD  Additional Notes: No pain on injection. No increased resistance to injection. Injection made in 5cc increments.  Good needle visualization.  Patient tolerated procedure well.

## 2023-01-08 NOTE — Discharge Instructions (Signed)
INSTRUCTIONS AFTER SURGERY ? ?Remove items at home which could result in a fall. This includes throw rugs or furniture in walking pathways ?ICE to the affected joint every three hours while awake for 30 minutes at a time, for at least the first 3-5 days, and then as needed for pain and swelling.  Continue to use ice for pain and swelling. You may notice swelling that will progress down to the foot and ankle.  This is normal after surgery.  Elevate your leg when you are not up walking on it.   ?Continue to use the breathing machine you got in the hospital (incentive spirometer) which will help keep your temperature down.  It is common for your temperature to cycle up and down following surgery, especially at night when you are not up moving around and exerting yourself.  The breathing machine keeps your lungs expanded and your temperature down. ? ? ?DIET:  As you were doing prior to hospitalization, we recommend a well-balanced diet. ? ? ?ACTIVITY ? ?Increase activity slowly as tolerated, but follow the weight bearing instructions below.   ?No driving for 6 weeks or until further direction given by your physician.  You cannot drive while taking narcotics.  ?No lifting or carrying greater than 10 lbs. until further directed by your surgeon. ?Avoid periods of inactivity such as sitting longer than an hour when not asleep. This helps prevent blood clots.  ?You may return to work once you are authorized by your doctor.  ? ? ? ?WEIGHT BEARING  ? ?Weight bearing as tolerated with assist device (walker, cane, etc) as directed, use it as long as suggested by your surgeon or therapist, typically at least 4-6 weeks. ? ?CONSTIPATION ? ?Constipation is defined medically as fewer than three stools per week and severe constipation as less than one stool per week.  Even if you have a regular bowel pattern at home, your normal regimen is likely to be disrupted due to multiple reasons following surgery.  Combination of anesthesia,  postoperative narcotics, change in appetite and fluid intake all can affect your bowels.  ? ?YOU MUST use at least one of the following options; they are listed in order of increasing strength to get the job done.  They are all available over the counter, and you may need to use some, POSSIBLY even all of these options:   ? ?Drink plenty of fluids (prune juice may be helpful) and high fiber foods ?Colace 100 mg by mouth twice a day  ?Senokot for constipation as directed and as needed Dulcolax (bisacodyl), take with full glass of water  ?Miralax (polyethylene glycol) once or twice a day as needed. ? ?If you have tried all these things and are unable to have a bowel movement in the first 3-4 days after surgery call either your surgeon or your primary doctor.   ? ?If you experience loose stools or diarrhea, hold the medications until you stool forms back up.  If your symptoms do not get better within 1 week or if they get worse, check with your doctor.  If you experience "the worst abdominal pain ever" or develop nausea or vomiting, please contact the office immediately for further recommendations for treatment. ? ? ?ITCHING:  If you experience itching with your medications, try taking only a single pain pill, or even half a pain pill at a time.  You can also use Benadryl over the counter for itching or also to help with sleep.  ? ?TED HOSE STOCKINGS:    Use stockings on both legs until for at least 2 weeks or as directed by physician office. They may be removed at night for sleeping. ? ?MEDICATIONS:  See your medication summary on the ?After Visit Summary? that nursing will review with you.  You may have some home medications which will be placed on hold until you complete the course of blood thinner medication.  It is important for you to complete the blood thinner medication as prescribed. ? ?PRECAUTIONS:  If you experience chest pain or shortness of breath - call 911 immediately for transfer to the hospital emergency  department.  ? ?If you develop a fever greater that 101 F, purulent drainage from wound, increased redness or drainage from wound, foul odor from the wound/dressing, or calf pain - CONTACT YOUR SURGEON.   ?                                                ?FOLLOW-UP APPOINTMENTS:  If you do not already have a post-op appointment, please call the office for an appointment to be seen by your surgeon.  Guidelines for how soon to be seen are listed in your ?After Visit Summary?, but are typically between 1-4 weeks after surgery. ? ?POST-OPERATIVE OPIOID TAPER INSTRUCTIONS: ?It is important to wean off of your opioid medication as soon as possible. If you do not need pain medication after your surgery it is ok to stop day one. ?Opioids include: ?Codeine, Hydrocodone(Norco, Vicodin), Oxycodone(Percocet, oxycontin) and hydromorphone amongst others.  ?Long term and even short term use of opiods can cause: ?Increased pain response ?Dependence ?Constipation ?Depression ?Respiratory depression ?And more.  ?Withdrawal symptoms can include ?Flu like symptoms ?Nausea, vomiting ?And more ?Techniques to manage these symptoms ?Hydrate well ?Eat regular healthy meals ?Stay active ?Use relaxation techniques(deep breathing, meditating, yoga) ?Do Not substitute Alcohol to help with tapering ?If you have been on opioids for less than two weeks and do not have pain than it is ok to stop all together.  ?Plan to wean off of opioids ?This plan should start within one week post op of your joint replacement. ?Maintain the same interval or time between taking each dose and first decrease the dose.  ?Cut the total daily intake of opioids by one tablet each day ?Next start to increase the time between doses. ?The last dose that should be eliminated is the evening dose.  ? ?MAKE SURE YOU:  ?Understand these instructions.  ?Get help right away if you are not doing well or get worse.  ? ? ?Thank you for letting us be a part of your medical care team.   It is a privilege we respect greatly.  We hope these instructions will help you stay on track for a fast and full recovery!  ? ? ? ? ?

## 2023-01-08 NOTE — Interval H&P Note (Signed)
History and Physical Interval Note:  01/08/2023 9:07 AM  Maria Malone  has presented today for surgery, with the diagnosis of RIGHT KNEE.  The various methods of treatment have been discussed with the patient and family. After consideration of risks, benefits and other options for treatment, the patient has consented to  Procedure(s): RIGHT CLOSED MANIPULATION KNEE (Right) as a surgical intervention.  The patient's history has been reviewed, patient examined, no change in status, stable for surgery.  I have reviewed the patient's chart and labs.  Questions were answered to the patient's satisfaction.     [name]

## 2023-01-08 NOTE — H&P (Signed)
Maria Malone is an 67 y.o. female.   Chief Complaint: Arthrofibrosis, right total knee  HPI:  [name] had right total knee arthroplasty about 2 months ago. She was progressing well initially with PT, but then failed to progress in terms of flexion and was limited to about 90 degrees with pain. Dr. Charlann Boxer discussed with her closed manipulation, which they decided to proceed with.  Past Medical History:  Diagnosis Date   Arthritis    Diabetes mellitus without complication (HCC)    GERD (gastroesophageal reflux disease)    High triglycerides    PONV (postoperative nausea and vomiting)     Past Surgical History:  Procedure Laterality Date   bone spur removed     cyst removed from neck      EYE SURGERY     bilateral cataract surgery and detached retina surgery   knee surgeries     bilateral scopes   rotator cuff surgery     right   TRIGGER FINGER RELEASE     TRIGGER FINGER RELEASE Right 08/2017    Family History  Problem Relation Age of Onset   Cancer Mother        lung- smoker   Diabetes Mother    Cancer Father        testicular, skin, mesothelioma   Diabetes Father    Breast cancer Sister 31       lung   Diabetes Paternal Aunt    Social History:  reports that she has never smoked. She has never used smokeless tobacco. She reports that she does not drink alcohol and does not use drugs.  Allergies:  Allergies  Allergen Reactions   Actos [Pioglitazone]     MUSCLE CRAMPS   Crestor [Rosuvastatin Calcium]     SEVERE JOINT PAIN    No medications prior to admission.    No results found for this or any previous visit (from the past 48 hour(s)). No results found.  Review of Systems  Constitutional:  Negative for chills and fever.  Respiratory:  Negative for cough and shortness of breath.   Cardiovascular:  Negative for chest pain.  Gastrointestinal:  Negative for nausea and vomiting.  Musculoskeletal:  Positive for arthralgias.     There were no vitals taken  for this visit. Physical Exam  Very pleasant 67 year old female awake alert and oriented. She is in no acute distress. She walks in the office without an assist device. She does have a stiff knee gait favoring her right knee.   Right knee exam: Her surgical incision is well-healed without erythema or warmth No palpable effusion Her arc of motion is around 5 degrees or so of passive extension without defect with flexion to 90 and 95 degrees with tightness and pain No significant lower extremity edema, erythema or calf tenderness  Assessment/Plan Arthrofibrosis, right total knee   Dr. Charlann Boxer has reviewed the risks, benefits, and expectations of this procedure with the patient. We will proceed with closed manipulation, right total knee.    Cassandria Anger, PA-C 01/08/2023, 7:12 AM

## 2023-01-08 NOTE — Transfer of Care (Signed)
Immediate Anesthesia Transfer of Care Note  Patient: Nesreen Philipps  Procedure(s) Performed: RIGHT CLOSED MANIPULATION KNEE (Right: Knee)  Patient Location: PACU  Anesthesia Type:MAC  Level of Consciousness: awake and alert   Airway & Oxygen Therapy: Patient Spontanous Breathing and Patient connected to face mask oxygen  Post-op Assessment: Report given to RN and Post -op Vital signs reviewed and stable  Post vital signs: Reviewed and stable  Last Vitals:  Vitals Value Taken Time  BP    Temp    Pulse 86 01/08/23 1007  Resp 16 01/08/23 1007  SpO2 100 % 01/08/23 1007  Vitals shown include unfiled device data.  Last Pain:  Vitals:   01/08/23 0830  TempSrc:   PainSc: 0-No pain      Patients Stated Pain Goal: 4 (01/08/23 0830)  Complications: No notable events documented.

## 2023-01-08 NOTE — Op Note (Signed)
NAMELAURAN, SUNDET MEDICAL RECORD NO: [id number] ACCOUNT NO: [id number] DATE OF BIRTH: 04/26/1956 FACILITY: [facility] LOCATION: [facility] PHYSICIAN: Madlyn Frankel. 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  PROCEDURE:  Manipulation of right total knee replacement under anesthesia.  SURGEON:  Madlyn Frankel. Charlann Boxer, MD  ASSISTANT:  None.  ANESTHESIA:  Regional plus MAC anesthetic.  BLOOD LOSS:  None.  COMPLICATIONS:  None.  INDICATIONS FOR PROCEDURE:  The patient is a 67 year old female about 2 months out from her right total knee replacement.  She had been performing physical therapy both formally as well as at home, trying to maximize her range of motion and strength.   When she was seen at around her 6 week visit she was noted to have some limitations in her flexion.  At that time, we discussed options including manipulation to try to improve the chance for long-term improved results.  Risks of manipulation include  risk of persistent stiffness and pain as well as potential injury to bone and ligament issues.  The goal would be to improve motion and pain.  Consent was obtained for the benefit of improved motion and pain control.  DESCRIPTION OF PROCEDURE:  The patient was brought to the operative theater.  Once adequate anesthesia was established.  A timeout was performed identifying the patient, planned procedure, and extremity.  I examined the knee under anesthesia and found  that she lacked between 5 and 10 degrees of extension and flexed passively to about 80 degrees.  I did attempt to improve the extension by placing her heel on to my shoulder and applied a downward pressure through her knee.  This was with a gentle  consistent  stretch, but I was unable to get significant lysis of adhesions posteriorly.  I then flexed her hip and applied pressure on her proximal tibia to flex her knee.  I was able to lyse adhesions and get her knee to flex back to about 120 degrees.   We also have her posterior calf, making contact with her posterior thigh.  I reattempted stretching in extension and flexion 3 different times before the conclusion of the case.  At the end of the case she was awoken from anesthetic and brought to the  recovery room in stable condition, tolerating the procedure well.  She has physical therapy already set up.  We will see her back in 2-3 weeks to check to see how she is progressing.  Medications prescribed.   PUS D: 01/08/2023 11:19:04 am T: 01/08/2023 12:33:00 pm  JOB: [id number]/ [id number]

## 2023-01-11 NOTE — Anesthesia Postprocedure Evaluation (Signed)
Anesthesia Post Note  Patient: Verenis Nicosia  Procedure(s) Performed: RIGHT CLOSED MANIPULATION KNEE (Right: Knee)     Patient location during evaluation: Endoscopy Anesthesia Type: MAC Level of consciousness: oriented, awake and alert and awake Pain management: pain level controlled Vital Signs Assessment: post-procedure vital signs reviewed and stable Respiratory status: spontaneous breathing, nonlabored ventilation, respiratory function stable and patient connected to nasal cannula oxygen Cardiovascular status: blood pressure returned to baseline and stable Postop Assessment: no headache, no backache and no apparent nausea or vomiting Anesthetic complications: no   No notable events documented.  Last Vitals:  Vitals:   01/08/23 1041 01/08/23 1057  BP: 125/67 137/87  Pulse:  83  Resp:    Temp:  36.7 C  SpO2: 100% 100%    Last Pain:  Vitals:   01/08/23 1145  TempSrc:   PainSc: 2                  Collene Schlichter

## 2023-01-12 ENCOUNTER — Encounter (HOSPITAL_COMMUNITY): Payer: Self-pay | Admitting: Orthopedic Surgery

## 2023-03-11 ENCOUNTER — Other Ambulatory Visit: Payer: Self-pay | Admitting: Obstetrics and Gynecology

## 2023-03-11 DIAGNOSIS — Z1231 Encounter for screening mammogram for malignant neoplasm of breast: Secondary | ICD-10-CM

## 2023-03-12 ENCOUNTER — Telehealth: Payer: Self-pay

## 2023-03-12 NOTE — Telephone Encounter (Signed)
Pt LVM in triage line inquiring which doctor she should have her mammo report sent to now that ML is gone.   Called pt back and LDVM on machine per DPR advising her that the imaging center had sent her order to be authorized by Dr. Karma Greaser whom is a provider in this practice and would be happy to resume her care if she desires prn or when she is due for her AEX.   Advised her to cb if she has any additional questions/concerns.   Routing to provider for final review and closing encounter.

## 2023-04-09 ENCOUNTER — Ambulatory Visit
Admission: RE | Admit: 2023-04-09 | Discharge: 2023-04-09 | Disposition: A | Discharge status: Home / Self Care | Payer: Medicare Other | Source: Ambulatory Visit | Attending: Obstetrics and Gynecology | Admitting: Obstetrics and Gynecology

## 2023-04-09 DIAGNOSIS — Z1231 Encounter for screening mammogram for malignant neoplasm of breast: Secondary | ICD-10-CM

## 2023-06-08 NOTE — Patient Instructions (Addendum)
 SURGICAL WAITING ROOM VISITATION Patients having surgery or a procedure may have no more than 2 support people in the waiting area - these visitors may rotate.    Children under the age of 44 must have an adult with them who is not the patient.  If the patient needs to stay at the hospital during part of their recovery, the visitor guidelines for inpatient rooms apply. Pre-op nurse will coordinate an appropriate time for 1 support person to accompany patient in pre-op.  This support person may not rotate.    Please refer to the [facility] website for the visitor guidelines for Inpatients (after your surgery is over and you are in a regular room).       Your procedure is scheduled on: 06-23-23   Report to [facility] Main Entrance    Report to admitting at 9:00 AM   Call this number if you have problems the morning of surgery [phone]   Do not eat food :After Midnight.   After Midnight you may have the following liquids until 8:30 AM DAY OF SURGERY  Water  Non-Citrus Juices (without pulp, NO RED-Apple, White grape, White cranberry) Black Coffee (NO MILK/CREAM OR CREAMERS, sugar ok)  Clear Tea (NO MILK/CREAM OR CREAMERS, sugar ok) regular and decaf                             Plain Jell-O (NO RED)                                           Fruit ices (not with fruit pulp, NO RED)                                     Popsicles (NO RED)                                                               Sports drinks like Gatorade (NO RED)                   The day of surgery:  Drink ONE (1) Pre-Surgery G2 by 8:30  AM the morning of surgery. Drink in one sitting. Do not sip.  This drink was given to you during your hospital  pre-op appointment visit. Nothing else to drink after completing the Pre-Surgery G2.          If you have questions, please contact your surgeon's office.   FOLLOW  ANY ADDITIONAL PRE OP INSTRUCTIONS YOU RECEIVED FROM YOUR SURGEON'S OFFICE!!!      Oral Hygiene is also important to reduce your risk of infection.                                    Remember - BRUSH YOUR TEETH THE MORNING OF SURGERY WITH YOUR REGULAR TOOTHPASTE   Do NOT smoke after Midnight   Take these medicines the morning of surgery with A SIP OF WATER :   Nexium  Okay to use eyedrops  Stop all vitamins and herbal supplements 7 days before surgery  How to Manage Your Diabetes Before and After Surgery  Why is it important to control my blood sugar before and after surgery? Improving blood sugar levels before and after surgery helps healing and can limit problems. A way of improving blood sugar control is eating a healthy diet by:  Eating less sugar and carbohydrates  Increasing activity/exercise  Talking with your doctor about reaching your blood sugar goals High blood sugars (greater than 180 mg/dL) can raise your risk of infections and slow your recovery, so you will need to focus on controlling your diabetes during the weeks before surgery. Make sure that the doctor who takes care of your diabetes knows about your planned surgery including the date and location.  How do I manage my blood sugar before surgery? Check your blood sugar at least 4 times a day, starting 2 days before surgery, to make sure that the level is not too high or low. Check your blood sugar the morning of your surgery when you wake up and every 2 hours until you get to the Short Stay unit. If your blood sugar is less than 70 mg/dL, you will need to treat for low blood sugar: Do not take insulin . Treat a low blood sugar (less than 70 mg/dL) with  cup of clear juice (cranberry or apple), 4 glucose tablets, OR glucose gel. Recheck blood sugar in 15 minutes after treatment (to make sure it is greater than 70 mg/dL). If your blood sugar is not greater than 70 mg/dL on recheck, call [phone] for further instructions. Report your blood sugar to the short stay nurse when you get to Short  Stay.  If you are admitted to the hospital after surgery: Your blood sugar will be checked by the staff and you will probably be given insulin  after surgery (instead of oral diabetes medicines) to make sure you have good blood sugar levels. The goal for blood sugar control after surgery is 80-180 mg/dL.   WHAT DO I DO ABOUT MY DIABETES MEDICATION?  Do not take oral diabetes medicines (pills) the morning of surgery.  Hold Mounjaro 7 days before surgery (do not take after 06-15-23).      DO NOT TAKE THE FOLLOWING 7 DAYS PRIOR TO SURGERY: Ozempic, Wegovy, Rybelsus (Semaglutide), Byetta (exenatide), Bydureon (exenatide ER), Victoza, Saxenda (liraglutide), or Trulicity (dulaglutide) Mounjaro (Tirzepatide) Adlyxin (Lixisenatide), Polyethylene Glycol Loxenatide.  Reviewed and Endorsed by [facility] Patient Education Committee, August 2015                              You may not have any metal on your body including hair pins, jewelry, and body piercing             Do not wear make-up, lotions, powders, perfumes or deodorant  Do not wear nail polish including gel and S&S, artificial/acrylic nails, or any other type of covering on natural nails including finger and toenails. If you have artificial nails, gel coating, etc. that needs to be removed by a nail salon please have this removed prior to surgery or surgery may need to be canceled/ delayed if the surgeon/ anesthesia feels like they are unable to be safely monitored.   Do not shave  48 hours prior to surgery.         Do not bring valuables to the hospital. [facility] IS NOT RESPONSIBLE   FOR VALUABLES.  Contacts, dentures or bridgework may not be worn into surgery.   Bring small overnight bag day of surgery.   DO NOT BRING YOUR HOME MEDICATIONS TO THE HOSPITAL. PHARMACY WILL DISPENSE MEDICATIONS LISTED ON YOUR MEDICATION LIST TO YOU DURING YOUR ADMISSION IN THE HOSPITAL!    Special Instructions: Bring a copy of your healthcare power  of attorney and living will documents the day of surgery if you haven't scanned them before.              Please read over the following fact sheets you were given: IF YOU HAVE QUESTIONS ABOUT YOUR PRE-OP INSTRUCTIONS PLEASE CALL [phone] Gwen  If you received a COVID test during your pre-op visit  it is requested that you wear a mask when out in public, stay away from anyone that may not be feeling well and notify your surgeon if you develop symptoms. If you test positive for Covid or have been in contact with anyone that has tested positive in the last 10 days please notify you surgeon.    Pre-operative 5 CHG Bath Instructions   You can play a key role in reducing the risk of infection after surgery. Your skin needs to be as free of germs as possible. You can reduce the number of germs on your skin by washing with CHG (chlorhexidine  gluconate) soap before surgery. CHG is an antiseptic soap that kills germs and continues to kill germs even after washing.   DO NOT use if you have an allergy to chlorhexidine /CHG or antibacterial soaps. If your skin becomes reddened or irritated, stop using the CHG and notify one of our RNs at [phone].   Please shower with the CHG soap starting 4 days before surgery using the following schedule:     Please keep in mind the following:  DO NOT shave, including legs and underarms, starting the day of your first shower.   You may shave your face at any point before/day of surgery.  Place clean sheets on your bed the day you start using CHG soap. Use a clean washcloth (not used since being washed) for each shower. DO NOT sleep with pets once you start using the CHG.   CHG Shower Instructions:  If you choose to wash your hair and private area, wash first with your normal shampoo/soap.  After you use shampoo/soap, rinse your hair and body thoroughly to remove shampoo/soap residue.  Turn the water  OFF and apply about 3 tablespoons (45 ml) of CHG soap to a  CLEAN washcloth.  Apply CHG soap ONLY FROM YOUR NECK DOWN TO YOUR TOES (washing for 3-5 minutes)  DO NOT use CHG soap on face, private areas, open wounds, or sores.  Pay special attention to the area where your surgery is being performed.  If you are having back surgery, having someone wash your back for you may be helpful. Wait 2 minutes after CHG soap is applied, then you may rinse off the CHG soap.  Pat dry with a clean towel  Put on clean clothes/pajamas   If you choose to wear lotion, please use ONLY the CHG-compatible lotions on the back of this paper.     Additional instructions for the day of surgery: DO NOT APPLY any lotions, deodorants, cologne, or perfumes.   Put on clean/comfortable clothes.  Brush your teeth.  Ask your nurse before applying any prescription medications to the skin.      CHG Compatible Lotions   Aveeno Moisturizing lotion  Cetaphil Moisturizing Cream  Cetaphil Moisturizing Lotion  Clairol Herbal Essence Moisturizing Lotion, Dry Skin  Clairol Herbal Essence Moisturizing Lotion, Extra Dry Skin  Clairol Herbal Essence Moisturizing Lotion, Normal Skin  Curel Age Defying Therapeutic Moisturizing Lotion with Alpha Hydroxy  Curel Extreme Care Body Lotion  Curel Soothing Hands Moisturizing Hand Lotion  Curel Therapeutic Moisturizing Cream, Fragrance-Free  Curel Therapeutic Moisturizing Lotion, Fragrance-Free  Curel Therapeutic Moisturizing Lotion, Original Formula  Eucerin Daily Replenishing Lotion  Eucerin Dry Skin Therapy Plus Alpha Hydroxy Crme  Eucerin Dry Skin Therapy Plus Alpha Hydroxy Lotion  Eucerin Original Crme  Eucerin Original Lotion  Eucerin Plus Crme Eucerin Plus Lotion  Eucerin TriLipid Replenishing Lotion  Keri Anti-Bacterial Hand Lotion  Keri Deep Conditioning Original Lotion Dry Skin Formula Softly Scented  Keri Deep Conditioning Original Lotion, Fragrance Free Sensitive Skin Formula  Keri Lotion Fast Absorbing Fragrance Free  Sensitive Skin Formula  Keri Lotion Fast Absorbing Softly Scented Dry Skin Formula  Keri Original Lotion  Keri Skin Renewal Lotion Keri Silky Smooth Lotion  Keri Silky Smooth Sensitive Skin Lotion  Nivea Body Creamy Conditioning Oil  Nivea Body Extra Enriched Lotion  Nivea Body Original Lotion  Nivea Body Sheer Moisturizing Lotion Nivea Crme  Nivea Skin Firming Lotion  NutraDerm 30 Skin Lotion  NutraDerm Skin Lotion  NutraDerm Therapeutic Skin Cream  NutraDerm Therapeutic Skin Lotion  ProShield Protective Hand Cream  Provon moisturizing lotion   PATIENT SIGNATURE_________________________________  NURSE SIGNATURE__________________________________  ________________________________________________________________________    Maria Malone  An incentive spirometer is a tool that can help keep your lungs clear and active. This tool measures how well you are filling your lungs with each breath. Taking long deep breaths may help reverse or decrease the chance of developing breathing (pulmonary) problems (especially infection) following: A long period of time when you are unable to move or be active. BEFORE THE PROCEDURE  If the spirometer includes an indicator to show your best effort, your nurse or respiratory therapist will set it to a desired goal. If possible, sit up straight or lean slightly forward. Try not to slouch. Hold the incentive spirometer in an upright position. INSTRUCTIONS FOR USE  Sit on the edge of your bed if possible, or sit up as far as you can in bed or on a chair. Hold the incentive spirometer in an upright position. Breathe out normally. Place the mouthpiece in your mouth and seal your lips tightly around it. Breathe in slowly and as deeply as possible, raising the piston or the ball toward the top of the column. Hold your breath for 3-5 seconds or for as long as possible. Allow the piston or ball to fall to the bottom of the column. Remove the  mouthpiece from your mouth and breathe out normally. Rest for a few seconds and repeat Steps 1 through 7 at least 10 times every 1-2 hours when you are awake. Take your time and take a few normal breaths between deep breaths. The spirometer may include an indicator to show your best effort. Use the indicator as a goal to work toward during each repetition. After each set of 10 deep breaths, practice coughing to be sure your lungs are clear. If you have an incision (the cut made at the time of surgery), support your incision when coughing by placing a pillow or rolled up towels firmly against it. Once you are able to get out of bed, walk around indoors and cough well. You may stop using the incentive spirometer when instructed by your caregiver.  RISKS AND COMPLICATIONS Take your time so you do not get dizzy or light-headed. If you are in pain, you may need to take or ask for pain medication before doing incentive spirometry. It is harder to take a deep breath if you are having pain. AFTER USE Rest and breathe slowly and easily. It can be helpful to keep track of a log of your progress. Your caregiver can provide you with a simple table to help with this. If you are using the spirometer at home, follow these instructions: SEEK MEDICAL CARE IF:  You are having difficultly using the spirometer. You have trouble using the spirometer as often as instructed. Your pain medication is not giving enough relief while using the spirometer. You develop fever of 100.5 F (38.1 C) or higher. SEEK IMMEDIATE MEDICAL CARE IF:  You cough up bloody sputum that had not been present before. You develop fever of 102 F (38.9 C) or greater. You develop worsening pain at or near the incision site. MAKE SURE YOU:  Understand these instructions. Will watch your condition. Will get help right away if you are not doing well or get worse. Document Released: 10/06/2006 Document Revised: 08/18/2011 Document Reviewed:  12/07/2006 [employer] Patient Information 2014 [employer], [place].   ________________________________________________________________________ WHAT IS A BLOOD TRANSFUSION? Blood Transfusion Information  A transfusion is the replacement of blood or some of its parts. Blood is made up of multiple cells which provide different functions. Red blood cells carry oxygen and are used for blood loss replacement. White blood cells fight against infection. Platelets control bleeding. Plasma helps clot blood. Other blood products are available for specialized needs, such as hemophilia or other clotting disorders. BEFORE THE TRANSFUSION  Who gives blood for transfusions?  Healthy volunteers who are fully evaluated to make sure their blood is safe. This is blood bank blood. Transfusion therapy is the safest it has ever been in the practice of medicine. Before blood is taken from a donor, a complete history is taken to make sure that person has no history of diseases nor engages in risky social behavior (examples are intravenous drug use or sexual activity with multiple partners). The donor's travel history is screened to minimize risk of transmitting infections, such as malaria. The donated blood is tested for signs of infectious diseases, such as HIV and hepatitis. The blood is then tested to be sure it is compatible with you in order to minimize the chance of a transfusion reaction. If you or a relative donates blood, this is often done in anticipation of surgery and is not appropriate for emergency situations. It takes many days to process the donated blood. RISKS AND COMPLICATIONS Although transfusion therapy is very safe and saves many lives, the main dangers of transfusion include:  Getting an infectious disease. Developing a transfusion reaction. This is an allergic reaction to something in the blood you were given. Every precaution is taken to prevent this. The decision to have a blood transfusion has been  considered carefully by your caregiver before blood is given. Blood is not given unless the benefits outweigh the risks. AFTER THE TRANSFUSION Right after receiving a blood transfusion, you will usually feel much better and more energetic. This is especially true if your red blood cells have gotten low (anemic). The transfusion raises the level of the red blood cells which carry oxygen, and this usually causes an energy increase. The nurse administering the transfusion will monitor you carefully for complications. HOME CARE INSTRUCTIONS  No special instructions are  needed after a transfusion. You may find your energy is better. Speak with your caregiver about any limitations on activity for underlying diseases you may have. SEEK MEDICAL CARE IF:  Your condition is not improving after your transfusion. You develop redness or irritation at the intravenous (IV) site. SEEK IMMEDIATE MEDICAL CARE IF:  Any of the following symptoms occur over the next 12 hours: Shaking chills. You have a temperature by mouth above 102 F (38.9 C), not controlled by medicine. Chest, back, or muscle pain. People around you feel you are not acting correctly or are confused. Shortness of breath or difficulty breathing. Dizziness and fainting. You get a rash or develop hives. You have a decrease in urine output. Your urine turns a dark color or changes to pink, red, or brown. Any of the following symptoms occur over the next 10 days: You have a temperature by mouth above 102 F (38.9 C), not controlled by medicine. Shortness of breath. Weakness after normal activity. The white part of the eye turns yellow (jaundice). You have a decrease in the amount of urine or are urinating less often. Your urine turns a dark color or changes to pink, red, or brown. Document Released: 05/23/2000 Document Revised: 08/18/2011 Document Reviewed: 01/10/2008 [employer] Patient Information 2014 [place],  [place].  _______________________________________________________________________

## 2023-06-12 NOTE — Progress Notes (Addendum)
 COVID Vaccine Completed:  Yes  Date of COVID positive in last 90 days:  No  PCP - Aurora Molt, MD Cardiologist - N/A Endocrinologist - Miya McKnight, DO  Chest x-ray -  EKG - 06-15-23 at PCP.  Copy on chart Stress Test - N/A ECHO - N/A Cardiac Cath - N/A Pacemaker/ICD device last checked: Spinal Cord Stimulator:N/A  Bowel Prep - N/A  Sleep Study - N/A CPAP -   Fasting Blood Sugar - 120 to 125 Checks Blood Sugar - 1 time a day  Mounjaro Last dose of GLP1 agonist- 06-07-23 GLP1 instructions:  Hold 7 days before surgery    Last dose of SGLT-2 inhibitors-  N/A SGLT-2 instructions:  Hold 3 days before surgery   Blood Thinner Instructions: N/A Aspirin  Instructions: Last Dose:  Activity level:  Can go up a flight of stairs and perform activities of daily living without stopping and without symptoms of chest pain or shortness of breath.  Anesthesia review: N/A  Patient denies shortness of breath, fever, cough and chest pain at PAT appointment  Patient verbalized understanding of instructions that were given to them at the PAT appointment. Patient was also instructed that they will need to review over the PAT instructions again at home before surgery.

## 2023-06-16 ENCOUNTER — Encounter (HOSPITAL_COMMUNITY): Payer: Self-pay

## 2023-06-16 ENCOUNTER — Other Ambulatory Visit: Payer: Self-pay

## 2023-06-16 ENCOUNTER — Encounter (HOSPITAL_COMMUNITY)
Admission: RE | Admit: 2023-06-16 | Discharge: 2023-06-16 | Disposition: A | Discharge status: Home / Self Care | Payer: Medicare Other | Source: Ambulatory Visit | Attending: Orthopedic Surgery | Admitting: Orthopedic Surgery

## 2023-06-16 VITALS — BP 125/73 | HR 81 | Temp 98.0°F | Resp 12 | Ht 65.0 in | Wt 156.0 lb

## 2023-06-16 DIAGNOSIS — E119 Type 2 diabetes mellitus without complications: Secondary | ICD-10-CM | POA: Insufficient documentation

## 2023-06-16 DIAGNOSIS — Z01818 Encounter for other preprocedural examination: Secondary | ICD-10-CM | POA: Diagnosis present

## 2023-06-16 DIAGNOSIS — M1612 Unilateral primary osteoarthritis, left hip: Secondary | ICD-10-CM | POA: Insufficient documentation

## 2023-06-16 LAB — BASIC METABOLIC PANEL
Anion gap: 10 (ref 5–15)
BUN: 11 mg/dL (ref 8–23)
CO2: 23 mmol/L (ref 22–32)
Calcium: 9.2 mg/dL (ref 8.9–10.3)
Chloride: 99 mmol/L (ref 98–111)
Creatinine, Ser: 0.91 mg/dL (ref 0.44–1.00)
GFR, Estimated: >60 mL/min (ref >60)
Glucose, Bld: 127 mg/dL — ABNORMAL HIGH (ref 70–99)
Potassium: 4.3 mmol/L (ref 3.5–5.1)
Sodium: 132 mmol/L — ABNORMAL LOW (ref 135–145)

## 2023-06-16 LAB — CBC
HCT: 40.3 % (ref 36.0–46.0)
Hemoglobin: 13 g/dL (ref 12.0–15.0)
MCH: 28.8 pg (ref 26.0–34.0)
MCHC: 32.3 g/dL (ref 30.0–36.0)
MCV: 89.2 fL (ref 80.0–100.0)
Platelets: 282 10*3/uL (ref 150–400)
RBC: 4.52 MIL/uL (ref 3.87–5.11)
RDW: 13.3 % (ref 11.5–15.5)
WBC: 6.5 10*3/uL (ref 4.0–10.5)
nRBC: 0 % (ref 0.0–0.2)

## 2023-06-16 LAB — SURGICAL PCR SCREEN
MRSA, PCR: NEGATIVE
Staphylococcus aureus: NEGATIVE

## 2023-06-16 LAB — GLUCOSE, CAPILLARY: Glucose-Capillary: 130 mg/dL — ABNORMAL HIGH (ref 70–99)

## 2023-06-17 LAB — HEMOGLOBIN A1C
Hgb A1c MFr Bld: 7 % — ABNORMAL HIGH (ref 4.8–5.6)
Mean Plasma Glucose: 154 mg/dL

## 2023-06-23 ENCOUNTER — Other Ambulatory Visit: Payer: Self-pay

## 2023-06-23 ENCOUNTER — Encounter (HOSPITAL_COMMUNITY): Payer: Self-pay | Admitting: Orthopedic Surgery

## 2023-06-23 ENCOUNTER — Ambulatory Visit (HOSPITAL_COMMUNITY): Payer: Medicare Other

## 2023-06-23 ENCOUNTER — Observation Stay (HOSPITAL_COMMUNITY)
Admission: RE | Admit: 2023-06-23 | Discharge: 2023-06-24 | Disposition: A | Discharge status: Home / Self Care | Payer: Medicare Other | Source: Ambulatory Visit | Attending: Orthopedic Surgery | Admitting: Orthopedic Surgery

## 2023-06-23 ENCOUNTER — Encounter (HOSPITAL_COMMUNITY)
Admission: RE | Disposition: A | Discharge status: Home / Self Care | Payer: Self-pay | Source: Ambulatory Visit | Attending: Orthopedic Surgery

## 2023-06-23 ENCOUNTER — Ambulatory Visit (HOSPITAL_BASED_OUTPATIENT_CLINIC_OR_DEPARTMENT_OTHER): Payer: Medicare Other | Admitting: Certified Registered"

## 2023-06-23 ENCOUNTER — Ambulatory Visit (HOSPITAL_COMMUNITY): Payer: Medicare Other | Admitting: Certified Registered"

## 2023-06-23 DIAGNOSIS — Z7984 Long term (current) use of oral hypoglycemic drugs: Secondary | ICD-10-CM | POA: Diagnosis not present

## 2023-06-23 DIAGNOSIS — M1612 Unilateral primary osteoarthritis, left hip: Principal | ICD-10-CM | POA: Insufficient documentation

## 2023-06-23 DIAGNOSIS — E119 Type 2 diabetes mellitus without complications: Secondary | ICD-10-CM | POA: Insufficient documentation

## 2023-06-23 DIAGNOSIS — Z01818 Encounter for other preprocedural examination: Secondary | ICD-10-CM

## 2023-06-23 DIAGNOSIS — Z96659 Presence of unspecified artificial knee joint: Secondary | ICD-10-CM | POA: Insufficient documentation

## 2023-06-23 DIAGNOSIS — Z79899 Other long term (current) drug therapy: Secondary | ICD-10-CM | POA: Insufficient documentation

## 2023-06-23 DIAGNOSIS — Z96642 Presence of left artificial hip joint: Secondary | ICD-10-CM

## 2023-06-23 DIAGNOSIS — Z96649 Presence of unspecified artificial hip joint: Secondary | ICD-10-CM

## 2023-06-23 HISTORY — PX: TOTAL HIP ARTHROPLASTY: SHX124

## 2023-06-23 LAB — ABO/RH: ABO/RH(D): O POS

## 2023-06-23 LAB — GLUCOSE, CAPILLARY
Glucose-Capillary: 142 mg/dL — ABNORMAL HIGH (ref 70–99)
Glucose-Capillary: 136 mg/dL — ABNORMAL HIGH (ref 70–99)

## 2023-06-23 LAB — TYPE AND SCREEN
ABO/RH(D): O POS
Antibody Screen: NEGATIVE

## 2023-06-23 SURGERY — ARTHROPLASTY, HIP, TOTAL, ANTERIOR APPROACH
Anesthesia: Monitor Anesthesia Care | Site: Hip | Laterality: Left

## 2023-06-23 MED ORDER — PROPOFOL 10 MG/ML IV BOLUS
INTRAVENOUS | Status: AC
  Filled 2023-06-23: qty 20

## 2023-06-23 MED ORDER — LIDOCAINE 2% (20 MG/ML) 5 ML SYRINGE
INTRAMUSCULAR | Status: DC | PRN
  Administered 2023-06-23: 40 mg via INTRAVENOUS

## 2023-06-23 MED ORDER — SODIUM CHLORIDE 0.9% FLUSH: 10.0000 mL | Freq: Two times a day (BID) | INTRAVENOUS | Status: DC

## 2023-06-23 MED ORDER — ONDANSETRON HCL 4 MG PO TABS: 4.0000 mg | ORAL_TABLET | Freq: Four times a day (QID) | ORAL | Status: DC | PRN

## 2023-06-23 MED ORDER — PHENYLEPHRINE HCL-NACL 20-0.9 MG/250ML-% IV SOLN
INTRAVENOUS | Status: DC | PRN
  Administered 2023-06-23: 40 ug/min via INTRAVENOUS

## 2023-06-23 MED ORDER — BUPIVACAINE IN DEXTROSE 0.75-8.25 % IT SOLN
INTRATHECAL | Status: DC | PRN
  Administered 2023-06-23: 1.6 mL via INTRATHECAL

## 2023-06-23 MED ORDER — TRANEXAMIC ACID-NACL 1000-0.7 MG/100ML-% IV SOLN
1000.0000 mg | INTRAVENOUS | Status: AC
  Administered 2023-06-23: 1000 mg via INTRAVENOUS
  Filled 2023-06-23: qty 100

## 2023-06-23 MED ORDER — CELECOXIB 200 MG PO CAPS
200.0000 mg | ORAL_CAPSULE | Freq: Two times a day (BID) | ORAL | Status: DC
  Administered 2023-06-23 – 2023-06-24 (×2): 200 mg via ORAL
  Filled 2023-06-23 (×2): qty 1

## 2023-06-23 MED ORDER — PROPOFOL 1000 MG/100ML IV EMUL
INTRAVENOUS | Status: AC
  Filled 2023-06-23: qty 100

## 2023-06-23 MED ORDER — METOCLOPRAMIDE HCL 5 MG/ML IJ SOLN
INTRAMUSCULAR | Status: AC
  Filled 2023-06-23: qty 2

## 2023-06-23 MED ORDER — ACETAMINOPHEN 10 MG/ML IV SOLN
1000.0000 mg | Freq: Once | INTRAVENOUS | Status: DC | PRN
  Administered 2023-06-23: 1000 mg via INTRAVENOUS

## 2023-06-23 MED ORDER — ACETAMINOPHEN 500 MG PO TABS
1000.0000 mg | ORAL_TABLET | Freq: Four times a day (QID) | ORAL | Status: DC
  Administered 2023-06-23 – 2023-06-24 (×3): 1000 mg via ORAL
  Filled 2023-06-23 (×3): qty 2

## 2023-06-23 MED ORDER — DEXAMETHASONE SODIUM PHOSPHATE 10 MG/ML IJ SOLN
8.0000 mg | Freq: Once | INTRAMUSCULAR | Status: AC
  Administered 2023-06-23: 8 mg via INTRAVENOUS

## 2023-06-23 MED ORDER — BUPIVACAINE-EPINEPHRINE 0.25% -1:200000 IJ SOLN
INTRAMUSCULAR | Status: AC
  Filled 2023-06-23: qty 1

## 2023-06-23 MED ORDER — METHOCARBAMOL 500 MG PO TABS
500.0000 mg | ORAL_TABLET | Freq: Four times a day (QID) | ORAL | Status: DC | PRN
  Administered 2023-06-23 – 2023-06-24 (×2): 500 mg via ORAL
  Filled 2023-06-23: qty 1

## 2023-06-23 MED ORDER — SENNA 8.6 MG PO TABS
2.0000 | ORAL_TABLET | Freq: Every day | ORAL | Status: DC
  Administered 2023-06-23: 17.2 mg via ORAL
  Filled 2023-06-23: qty 2

## 2023-06-23 MED ORDER — HYDROMORPHONE HCL 1 MG/ML IJ SOLN
0.5000 mg | INTRAMUSCULAR | Status: DC | PRN
Start: 2023-06-23 — End: 2023-06-24

## 2023-06-23 MED ORDER — INSULIN ASPART 100 UNIT/ML IJ SOLN
0.0000 [IU] | INTRAMUSCULAR | Status: DC | PRN
  Administered 2023-06-23: 2 [IU] via SUBCUTANEOUS
  Filled 2023-06-23: qty 1

## 2023-06-23 MED ORDER — 0.9 % SODIUM CHLORIDE (POUR BTL) OPTIME
TOPICAL | Status: DC | PRN
  Administered 2023-06-23: 1000 mL

## 2023-06-23 MED ORDER — DIPHENHYDRAMINE HCL 12.5 MG/5ML PO ELIX: 12.5000 mg | ORAL_SOLUTION | ORAL | Status: DC | PRN

## 2023-06-23 MED ORDER — METOCLOPRAMIDE HCL 5 MG/ML IJ SOLN
5.0000 mg | Freq: Three times a day (TID) | INTRAMUSCULAR | Status: DC | PRN
  Administered 2023-06-23: 10 mg via INTRAVENOUS

## 2023-06-23 MED ORDER — OXYCODONE HCL 5 MG PO TABS
10.0000 mg | ORAL_TABLET | ORAL | Status: DC | PRN
Start: 2023-06-23 — End: 2023-06-24

## 2023-06-23 MED ORDER — METOCLOPRAMIDE HCL 5 MG PO TABS: 5.0000 mg | ORAL_TABLET | Freq: Three times a day (TID) | ORAL | Status: DC | PRN

## 2023-06-23 MED ORDER — FENTANYL CITRATE (PF) 100 MCG/2ML IJ SOLN
INTRAMUSCULAR | Status: AC
  Filled 2023-06-23: qty 2

## 2023-06-23 MED ORDER — SODIUM CHLORIDE 0.9% FLUSH
3.0000 mL | Freq: Two times a day (BID) | INTRAVENOUS | Status: DC
  Administered 2023-06-24: 10 mL via INTRAVENOUS

## 2023-06-23 MED ORDER — METFORMIN HCL ER 500 MG PO TB24
1000.0000 mg | ORAL_TABLET | Freq: Two times a day (BID) | ORAL | Status: DC
  Administered 2023-06-24: 1000 mg via ORAL
  Filled 2023-06-23: qty 2

## 2023-06-23 MED ORDER — STERILE WATER FOR IRRIGATION IR SOLN
Status: DC | PRN
  Administered 2023-06-23: 1000 mL

## 2023-06-23 MED ORDER — LACTATED RINGERS IV SOLN: INTRAVENOUS | Status: DC | PRN

## 2023-06-23 MED ORDER — LIDOCAINE HCL (PF) 2 % IJ SOLN
INTRAMUSCULAR | Status: AC
  Filled 2023-06-23: qty 5

## 2023-06-23 MED ORDER — FENTANYL CITRATE PF 50 MCG/ML IJ SOSY
PREFILLED_SYRINGE | INTRAMUSCULAR | Status: AC
  Filled 2023-06-23: qty 2

## 2023-06-23 MED ORDER — CEFAZOLIN SODIUM-DEXTROSE 2-4 GM/100ML-% IV SOLN
2.0000 g | Freq: Four times a day (QID) | INTRAVENOUS | Status: AC
  Administered 2023-06-23: 2 g via INTRAVENOUS
  Filled 2023-06-23: qty 100

## 2023-06-23 MED ORDER — ORAL CARE MOUTH RINSE: 15.0000 mL | Freq: Once | OROMUCOSAL | Status: AC

## 2023-06-23 MED ORDER — CHLORHEXIDINE GLUCONATE 0.12 % MT SOLN
15.0000 mL | Freq: Once | OROMUCOSAL | Status: AC
  Administered 2023-06-23: 15 mL via OROMUCOSAL

## 2023-06-23 MED ORDER — SODIUM CHLORIDE (PF) 0.9 % IJ SOLN
INTRAMUSCULAR | Status: DC | PRN
  Administered 2023-06-23: 61 mL

## 2023-06-23 MED ORDER — DEXAMETHASONE SODIUM PHOSPHATE 10 MG/ML IJ SOLN
INTRAMUSCULAR | Status: AC
  Filled 2023-06-23: qty 1

## 2023-06-23 MED ORDER — KETOROLAC TROMETHAMINE 30 MG/ML IJ SOLN
INTRAMUSCULAR | Status: AC
  Filled 2023-06-23: qty 1

## 2023-06-23 MED ORDER — CEFAZOLIN SODIUM-DEXTROSE 2-4 GM/100ML-% IV SOLN
INTRAVENOUS | Status: AC
  Administered 2023-06-23: 2 g via INTRAVENOUS
  Filled 2023-06-23: qty 100

## 2023-06-23 MED ORDER — METHOCARBAMOL 500 MG PO TABS
ORAL_TABLET | ORAL | Status: AC
  Filled 2023-06-23: qty 1

## 2023-06-23 MED ORDER — MIDAZOLAM HCL 2 MG/2ML IJ SOLN
INTRAMUSCULAR | Status: DC | PRN
  Administered 2023-06-23: 2 mg via INTRAVENOUS

## 2023-06-23 MED ORDER — PROPOFOL 500 MG/50ML IV EMUL
INTRAVENOUS | Status: DC | PRN
  Administered 2023-06-23: 75 ug/kg/min via INTRAVENOUS

## 2023-06-23 MED ORDER — ONDANSETRON HCL 4 MG/2ML IJ SOLN
INTRAMUSCULAR | Status: DC | PRN
  Administered 2023-06-23: 4 mg via INTRAVENOUS

## 2023-06-23 MED ORDER — PHENOL 1.4 % MT LIQD: 1.0000 | OROMUCOSAL | Status: DC | PRN

## 2023-06-23 MED ORDER — PROPOFOL 10 MG/ML IV BOLUS
INTRAVENOUS | Status: DC | PRN
  Administered 2023-06-23: 30 mg via INTRAVENOUS
  Administered 2023-06-23: 20 mg via INTRAVENOUS

## 2023-06-23 MED ORDER — CEFAZOLIN SODIUM-DEXTROSE 2-4 GM/100ML-% IV SOLN
2.0000 g | INTRAVENOUS | Status: AC
  Administered 2023-06-23: 2 g via INTRAVENOUS
  Filled 2023-06-23: qty 100

## 2023-06-23 MED ORDER — PANTOPRAZOLE SODIUM 40 MG PO TBEC
40.0000 mg | DELAYED_RELEASE_TABLET | Freq: Every day | ORAL | Status: DC
Start: 2023-06-24 — End: 2023-06-24
  Administered 2023-06-24: 40 mg via ORAL
  Filled 2023-06-23: qty 1

## 2023-06-23 MED ORDER — METHOCARBAMOL 1000 MG/10ML IJ SOLN: 500.0000 mg | Freq: Four times a day (QID) | INTRAMUSCULAR | Status: DC | PRN

## 2023-06-23 MED ORDER — OXYCODONE HCL 5 MG PO TABS
ORAL_TABLET | ORAL | Status: AC
  Filled 2023-06-23: qty 1

## 2023-06-23 MED ORDER — FENTANYL CITRATE (PF) 100 MCG/2ML IJ SOLN
INTRAMUSCULAR | Status: DC | PRN
  Administered 2023-06-23 (×2): 25 ug via INTRAVENOUS

## 2023-06-23 MED ORDER — MENTHOL 3 MG MT LOZG
1.0000 | LOZENGE | OROMUCOSAL | Status: DC | PRN
Start: 2023-06-23 — End: 2023-06-24

## 2023-06-23 MED ORDER — BISACODYL 10 MG RE SUPP: 10.0000 mg | Freq: Every day | RECTAL | Status: DC | PRN

## 2023-06-23 MED ORDER — MIDAZOLAM HCL 2 MG/2ML IJ SOLN
INTRAMUSCULAR | Status: AC
  Filled 2023-06-23: qty 2

## 2023-06-23 MED ORDER — ASPIRIN 81 MG PO CHEW
81.0000 mg | CHEWABLE_TABLET | Freq: Two times a day (BID) | ORAL | Status: DC
  Administered 2023-06-23 – 2023-06-24 (×2): 81 mg via ORAL
  Filled 2023-06-23 (×2): qty 1

## 2023-06-23 MED ORDER — SODIUM CHLORIDE (PF) 0.9 % IJ SOLN
INTRAMUSCULAR | Status: AC
  Filled 2023-06-23: qty 30

## 2023-06-23 MED ORDER — POVIDONE-IODINE 10 % EX SWAB: 2.0000 | Freq: Once | CUTANEOUS | Status: DC

## 2023-06-23 MED ORDER — FENTANYL CITRATE PF 50 MCG/ML IJ SOSY
25.0000 ug | PREFILLED_SYRINGE | INTRAMUSCULAR | Status: DC | PRN
  Administered 2023-06-23: 50 ug via INTRAVENOUS

## 2023-06-23 MED ORDER — ACETAMINOPHEN 10 MG/ML IV SOLN
INTRAVENOUS | Status: AC
  Filled 2023-06-23: qty 100

## 2023-06-23 MED ORDER — TRANEXAMIC ACID-NACL 1000-0.7 MG/100ML-% IV SOLN
INTRAVENOUS | Status: AC
  Filled 2023-06-23: qty 100

## 2023-06-23 MED ORDER — TRANEXAMIC ACID-NACL 1000-0.7 MG/100ML-% IV SOLN
1000.0000 mg | Freq: Once | INTRAVENOUS | Status: AC
  Administered 2023-06-23: 1000 mg via INTRAVENOUS

## 2023-06-23 MED ORDER — SODIUM CHLORIDE 0.9 % IV BOLUS
500.0000 mL | Freq: Once | INTRAVENOUS | Status: AC
  Administered 2023-06-23: 500 mL via INTRAVENOUS

## 2023-06-23 MED ORDER — OXYCODONE HCL 5 MG PO TABS
5.0000 mg | ORAL_TABLET | ORAL | Status: DC | PRN
  Administered 2023-06-23 – 2023-06-24 (×4): 5 mg via ORAL
  Filled 2023-06-23 (×3): qty 1

## 2023-06-23 MED ORDER — POLYETHYLENE GLYCOL 3350 17 G PO PACK
17.0000 g | PACK | Freq: Two times a day (BID) | ORAL | Status: DC
  Administered 2023-06-23 – 2023-06-24 (×2): 17 g via ORAL
  Filled 2023-06-23 (×2): qty 1

## 2023-06-23 MED ORDER — ALUM & MAG HYDROXIDE-SIMETH 200-200-20 MG/5ML PO SUSP
30.0000 mL | ORAL | Status: DC | PRN
Start: 2023-06-23 — End: 2023-06-24

## 2023-06-23 MED ORDER — DEXAMETHASONE SODIUM PHOSPHATE 10 MG/ML IJ SOLN
10.0000 mg | Freq: Once | INTRAMUSCULAR | Status: AC
  Administered 2023-06-24: 10 mg via INTRAVENOUS
  Filled 2023-06-23: qty 1

## 2023-06-23 MED ORDER — ONDANSETRON HCL 4 MG/2ML IJ SOLN
INTRAMUSCULAR | Status: AC
  Filled 2023-06-23: qty 2

## 2023-06-23 MED ORDER — ONDANSETRON HCL 4 MG/2ML IJ SOLN
4.0000 mg | Freq: Four times a day (QID) | INTRAMUSCULAR | Status: DC | PRN
  Administered 2023-06-23: 4 mg via INTRAVENOUS

## 2023-06-23 MED ORDER — SODIUM CHLORIDE 0.9% FLUSH: 3.0000 mL | INTRAVENOUS | Status: DC | PRN

## 2023-06-23 MED ORDER — SODIUM CHLORIDE 0.9 % IV BOLUS: 500.0000 mL | Freq: Once | INTRAVENOUS | Status: DC

## 2023-06-23 SURGICAL SUPPLY — 36 items
BAG COUNTER SPONGE SURGICOUNT (BAG) IMPLANT
BAG ZIPLOCK 12X15 (MISCELLANEOUS) IMPLANT
BLADE SAG 18X100X1.27 (BLADE) ×1 IMPLANT
COVER PERINEAL POST (MISCELLANEOUS) ×1 IMPLANT
COVER SURGICAL LIGHT HANDLE (MISCELLANEOUS) ×1 IMPLANT
CUP ACETBLR 54 OD PINNACLE (Hips) IMPLANT
DERMABOND ADVANCED .7 DNX12 (GAUZE/BANDAGES/DRESSINGS) ×1 IMPLANT
DRAPE FOOT SWITCH (DRAPES) ×1 IMPLANT
DRAPE STERI IOBAN 125X83 (DRAPES) ×1 IMPLANT
DRAPE U-SHAPE 47X51 STRL (DRAPES) ×2 IMPLANT
DRESSING AQUACEL AG SP 3.5X10 (GAUZE/BANDAGES/DRESSINGS) ×1 IMPLANT
DRSG AQUACEL AG SP 3.5X10 (GAUZE/BANDAGES/DRESSINGS) ×1
DURAPREP 26ML APPLICATOR (WOUND CARE) ×1 IMPLANT
ELECT REM PT RETURN 15FT ADLT (MISCELLANEOUS) ×1 IMPLANT
GLOVE BIO SURGEON STRL SZ 6 (GLOVE) ×1 IMPLANT
GLOVE BIOGEL PI IND STRL 6.5 (GLOVE) ×1 IMPLANT
GLOVE BIOGEL PI IND STRL 7.5 (GLOVE) ×1 IMPLANT
GLOVE ORTHO TXT STRL SZ7.5 (GLOVE) ×2 IMPLANT
GOWN STRL REUS W/ TWL LRG LVL3 (GOWN DISPOSABLE) ×2 IMPLANT
HEAD CERAMIC DELTA 36 PLUS 1.5 (Hips) IMPLANT
HOLDER FOLEY CATH W/STRAP (MISCELLANEOUS) ×1 IMPLANT
KIT TURNOVER KIT A (KITS) IMPLANT
LINER NEUTRAL 54X36MM PLUS 4 (Hips) IMPLANT
NDL SAFETY ECLIPSE 18X1.5 (NEEDLE) IMPLANT
PACK ANTERIOR HIP CUSTOM (KITS) ×1 IMPLANT
SCREW 6.5MMX30MM (Screw) IMPLANT
STEM FEMORAL SZ5 HIGH ACTIS (Stem) IMPLANT
SUT MNCRL AB 4-0 PS2 18 (SUTURE) ×1 IMPLANT
SUT STRATAFIX 0 PDS 27 VIOLET (SUTURE) ×1
SUT VIC AB 1 CT1 36 (SUTURE) ×3 IMPLANT
SUT VIC AB 2-0 CT1 TAPERPNT 27 (SUTURE) ×2 IMPLANT
SUTURE STRATFX 0 PDS 27 VIOLET (SUTURE) ×1 IMPLANT
SYR 3ML LL SCALE MARK (SYRINGE) IMPLANT
TRAY FOLEY MTR SLVR 14FR STAT (SET/KITS/TRAYS/PACK) IMPLANT
TUBE SUCTION HIGH CAP CLEAR NV (SUCTIONS) ×1 IMPLANT
WATER STERILE IRR 1000ML POUR (IV SOLUTION) ×1 IMPLANT

## 2023-06-23 NOTE — H&P (Signed)
 TOTAL HIP ADMISSION H&P  Patient is admitted for left total hip arthroplasty.  Therapy Plans: HEP Disposition: Home with brother (staying with patient) Planned DVT Prophylaxis: aspirin  81mg  BID DME needed: none PCP: Dr. Joshua, appointment on Monday TXA: IV Allergies: crestor - arthralgia Anesthesia Concerns: none BMI: 26.3 Last HgbA1c: 7.1%   Other: - Planning on SDD - had to have a closed manip with right TKA in June - will use celebrex  afterwards  - professor of exercise science - oxycodone , robaxin , tylenol , celebrex  - took 1 pain pill with rotator cuff repair - No hx of VTE or cancer - SEND MEDS AHEAD  Subjective:  Chief Complaint: left hip pain  HPI: Maria Malone, 68 y.o. female, has a history of pain and functional disability in the left hip(s) due to arthritis and patient has failed non-surgical conservative treatments for greater than 12 weeks to include NSAID's and/or analgesics and activity modification.  Onset of symptoms was gradual starting 1 years ago with gradually worsening course since that time.The patient noted no past surgery on the left hip(s).  Patient currently rates pain in the left hip at 8 out of 10 with activity. Patient has worsening of pain with activity and weight bearing and pain that interfers with activities of daily living. Patient has evidence of joint space narrowing by imaging studies. This condition presents safety issues increasing the risk of falls.   There is no current active infection.  Patient Active Problem List   Diagnosis Date Noted   Arthrofibrosis of knee joint, right 01/08/2023   History of postmenopausal HRT 03/26/2016   History of vitamin D  deficiency 03/26/2016   Vaginal atrophy 03/26/2016   Family history of colonic polyps 03/26/2015   Family history of breast cancer 03/26/2015   Diabetes (HCC) 01/17/2013   Other and unspecified hyperlipidemia 01/17/2013   Past Medical History:  Diagnosis Date   Arthritis     Diabetes mellitus without complication (HCC)    GERD (gastroesophageal reflux disease)    High triglycerides    PONV (postoperative nausea and vomiting)     Past Surgical History:  Procedure Laterality Date   bone spur removed     CLOSED MANIPULATION KNEE WITH STERIOD INJECTION Right 01/08/2023   Procedure: RIGHT CLOSED MANIPULATION KNEE;  Surgeon: Ernie Cough, MD;  Location: [facility];  Service: Orthopedics;  Laterality: Right;   cyst removed from neck      EYE SURGERY     bilateral cataract surgery and detached retina surgery   KNEE ARTHROPLASTY     knee surgeries     bilateral scopes   rotator cuff surgery     right   TRIGGER FINGER RELEASE     TRIGGER FINGER RELEASE Right 08/2017    No current facility-administered medications for this encounter.   Current Outpatient Medications  Medication Sig Dispense Refill Last Dose/Taking   Ca Phosphate-Cholecalciferol (CALCIUM WITH D3 PO) Take 1 tablet by mouth in the morning.   Taking   carboxymethylcellulose (REFRESH PLUS) 0.5 % SOLN Place 1 drop into both eyes See admin instructions. Instill 1 drop into both eyes (scheduled) every morning & may instill 1-2 drops into both eyes as needed for dry/irritated eyes.   Taking   cholecalciferol (VITAMIN D3) 25 MCG (1000 UNIT) tablet Take 1,000 Units by mouth in the morning.   Taking   esomeprazole (NEXIUM) 20 MG capsule Take 20 mg by mouth daily before breakfast.   Taking   metFORMIN  (GLUCOPHAGE -XR) 500 MG 24 hr tablet Take  1,000 mg by mouth 2 (two) times daily with a meal.   Taking   MOUNJARO 5 MG/0.5ML Pen Inject 5 mg into the skin every Sunday.   Taking   Multiple Vitamin (MULTIVITAMIN WITH MINERALS) TABS tablet Take 1 tablet by mouth in the morning. One A Day Women's 50+   Taking   Omega-3 Fatty Acids (FISH OIL) 1200 MG CAPS Take 2,400 mg by mouth in the morning.   Taking   Blood Glucose Monitoring Suppl (ACCU-CHEK GUIDE) w/Device KIT USE 1 EACH DAILY      Allergies  Allergen Reactions    Actos [Pioglitazone]     MUSCLE CRAMPS   Crestor [Rosuvastatin Calcium]     SEVERE JOINT PAIN    Social History   Tobacco Use   Smoking status: Never   Smokeless tobacco: Never  Substance Use Topics   Alcohol use: Never    Family History  Problem Relation Age of Onset   Cancer Mother        lung- smoker   Diabetes Mother    Cancer Father        testicular, skin, mesothelioma   Diabetes Father    Breast cancer Sister 45       lung   Diabetes Paternal Aunt      Review of Systems  Constitutional:  Negative for chills and fever.  Respiratory:  Negative for cough and shortness of breath.   Cardiovascular:  Negative for chest pain.  Gastrointestinal:  Negative for nausea and vomiting.  Musculoskeletal:  Positive for arthralgias.     Objective:  Physical Exam Well nourished and well developed. General: Alert and oriented x3, cooperative and pleasant, no acute distress. Head: normocephalic, atraumatic, neck supple. Eyes: EOMI.  Musculoskeletal: Right knee exam: Her surgical incision remains well-healed without signs of infection AROM 3-120 No palpable effusion Tightness in general over the knee Stable medial and lateral collateral ligaments  Left hip exam: Painful and limited hip flexion internal rotation with pelvic tilting and internal rotation to 5 degrees, external rotation to 20 degrees Slight external rotation contracture with active hip flexion Neurovascular intact distally  Calves soft and nontender. Motor function intact in LE. Strength 5/5 LE bilaterally. Neuro: Distal pulses 2+. Sensation to light touch intact in LE.  Vital signs in last 24 hours:    Labs:   Estimated body mass index is 25.96 kg/m as calculated from the following:   Height as of 06/16/23: 5' 5 (1.651 m).   Weight as of 06/16/23: 70.8 kg.   Imaging Review Plain radiographs demonstrate severe degenerative joint disease of the left hip(s). The bone quality appears to be adequate  for age and reported activity level.      Assessment/Plan:  End stage arthritis, left hip(s)  The patient history, physical examination, clinical judgement of the provider and imaging studies are consistent with end stage degenerative joint disease of the left hip(s) and total hip arthroplasty is deemed medically necessary. The treatment options including medical management, injection therapy, arthroscopy and arthroplasty were discussed at length. The risks and benefits of total hip arthroplasty were presented and reviewed. The risks due to aseptic loosening, infection, stiffness, dislocation/subluxation,  thromboembolic complications and other imponderables were discussed.  The patient acknowledged the explanation, agreed to proceed with the plan and consent was signed. Patient is being admitted for inpatient treatment for surgery, pain control, PT, OT, prophylactic antibiotics, VTE prophylaxis, progressive ambulation and ADL's and discharge planning.The patient is planning to be discharged  home.  Rosina Calin, PA-C Orthopedic Surgery [facility] [place] [phone]

## 2023-06-23 NOTE — Progress Notes (Signed)
 Patient did not pass rehab she is having orthostation hypotension with position changes that is symptomatic. Dr. Charlann Boxer informed reported would input admission orders for patient.

## 2023-06-23 NOTE — Anesthesia Procedure Notes (Signed)
 Spinal  Patient location during procedure: OR Start time: 06/23/2023 11:10 AM Reason for block: surgical anesthesia Staffing Performed: resident/CRNA  Anesthesiologist: Dorethea Cordella SQUIBB, DO Resident/CRNA: Metta Andrea NOVAK, CRNA Performed by: Metta Andrea NOVAK, CRNA Authorized by: Dorethea Cordella SQUIBB, DO   Preanesthetic Checklist Completed: patient identified, IV checked, site marked, risks and benefits discussed, surgical consent, monitors and equipment checked, pre-op evaluation and timeout performed Spinal Block Patient position: sitting Prep: DuraPrep and site prepped and draped Patient monitoring: continuous pulse ox, blood pressure, cardiac monitor and heart rate Approach: midline Location: L3-4 Injection technique: single-shot Needle Needle type: Pencan  Needle gauge: 24 G Needle length: 10 cm Assessment Events: CSF return Additional Notes Pt placed in sitting position, spinal kit expiration date checked and verified, timeout performed, + CSF, - heme, pt tolerated well Dr Dorethea present and supervising throughout SAB placement. Adequate sensory level.

## 2023-06-23 NOTE — Anesthesia Procedure Notes (Signed)
 Procedure Name: MAC Date/Time: 06/23/2023 11:03 AM  Performed by: Metta Andrea NOVAK, CRNAPre-anesthesia Checklist: Patient identified, Emergency Drugs available, Suction available, Patient being monitored and Timeout performed Oxygen Delivery Method: Simple face mask Placement Confirmation: positive ETCO2

## 2023-06-23 NOTE — Evaluation (Signed)
 Physical Therapy Evaluation Patient Details Name: Maria Malone MRN: [id number] DOB: 1956-02-21 Today's Date: 06/23/2023  History of Present Illness  68 yo s/p L DATHA 06/23/2023 with PMH of RTKA 01/2023 and manipulation 01/2023 .  Clinical Impression  Pt is s/p L DATHA resulting in the deficits listed below (see PT Problem List). Assessed pt in PACU for DC same day of surgery , however pt was symptomatic with orthostatic hypotension with standing after 2 minutes. Pt reported after last surgery she did pass out and fall getting into her car.  Recommend pt stay to continue to feel better after surgery for safety with mobility. Feel she will progress well when BP issues resolve. Pt will benefit from acute skilled PT to increase their independence and safety with mobility to facilitate discharge.          If plan is discharge home, recommend the following: A little help with walking and/or transfers;A little help with bathing/dressing/bathroom;Assistance with cooking/housework;Assist for transportation;Help with stairs or ramp for entrance   Can travel by private vehicle        Equipment Recommendations None recommended by PT (has equipment already)  Recommendations for Other Services       Functional Status Assessment Patient has had a recent decline in their functional status and demonstrates the ability to make significant improvements in function in a reasonable and predictable amount of time.     Precautions / Restrictions Precautions Precautions: None Restrictions Weight Bearing Restrictions Per Provider Order: No      Mobility  Bed Mobility Overal bed mobility: Needs Assistance Bed Mobility: Supine to Sit, Sit to Supine     Supine to sit: Contact guard Sit to supine: Contact guard assist   General bed mobility comments: min A for LLE back onto the bed    Transfers Overall transfer level: Needs assistance Equipment used: Rolling walker (2 wheels) Transfers: Sit  to/from Stand Sit to Stand: Contact guard assist           General transfer comment: just to be ther since pt reported beginning to feel dizy, otherwise pt was very capable of sit to stand at supervision level    Ambulation/Gait Ambulation/Gait assistance:  (took steps froward 2-3 and backwards at bedside due to pt reporting dizzyness and light headedness began to progress the longer she was upright)                Stairs            Wheelchair Mobility     Tilt Bed    Modified Rankin (Stroke Patients Only)       Balance                                             Pertinent Vitals/Pain Pain Assessment Pain Assessment: No/denies pain (no pain at this tie, just feels some  tightness in that left hip)    Home Living Family/patient expects to be discharged to:: Private residence Living Arrangements: Alone (brother to stay with her for first few nights) Available Help at Discharge: Family Type of Home: House Home Access: Level entry;Stairs to enter Entrance Stairs-Rails:  (curb) Entrance Stairs-Number of Steps: 1   Home Layout: One level   Additional Comments: 1 curb to step up onto in parking lot of apartment complex then about 30 feet to the door    Prior Function  Prior Level of Function : Independent/Modified Independent             Mobility Comments: pt states she has gotten her R knee to over 100 degrees flexion with therapy and beginning to do step over step on the stairs.       Extremity/Trunk Assessment        Lower Extremity Assessment Lower Extremity Assessment: LLE deficits/detail LLE Deficits / Details: s/p L THA  with some residual left from the spinal about to perform SLR with minimal assistance.    Cervical / Trunk Assessment Cervical / Trunk Assessment: Normal  Communication   Communication Communication: No apparent difficulties  Cognition Arousal: Alert Behavior During Therapy: WFL for tasks  assessed/performed Overall Cognitive Status: Within Functional Limits for tasks assessed                                          General Comments      Exercises Total Joint Exercises Ankle Circles/Pumps: AROM, Both, 10 reps Quad Sets: AROM, Both, 10 reps Heel Slides: AAROM, Left, 5 reps Hip ABduction/ADduction: AAROM, Left, 5 reps Straight Leg Raises: AAROM, Left, 5 reps   Assessment/Plan    PT Assessment Patient needs continued PT services  PT Problem List         PT Treatment Interventions DME instruction;Gait training;Stair training;Therapeutic activities;Therapeutic exercise;Patient/family education    PT Goals (Current goals can be found in the Care Plan section)  Acute Rehab PT Goals Patient Stated Goal: I want to be safe when I go home PT Goal Formulation: With patient Time For Goal Achievement: 07/07/23 Potential to Achieve Goals: Good    Frequency 7X/week     Co-evaluation               AM-PAC PT 6 Clicks Mobility  Outcome Measure Help needed turning from your back to your side while in a flat bed without using bedrails?: A Little Help needed moving from lying on your back to sitting on the side of a flat bed without using bedrails?: A Little Help needed moving to and from a bed to a chair (including a wheelchair)?: A Little Help needed standing up from a chair using your arms (e.g., wheelchair or bedside chair)?: A Lot Help needed to walk in hospital room?: A Lot Help needed climbing 3-5 steps with a railing? : A Lot 6 Click Score: 15    End of Session Equipment Utilized During Treatment: Gait belt Activity Tolerance: Patient tolerated treatment well Patient left: in bed;with call bell/phone within reach;with family/visitor present Nurse Communication: Mobility status PT Visit Diagnosis: Difficulty in walking, not elsewhere classified (R26.2)    Time: 8360-8271 PT Time Calculation (min) (ACUTE ONLY): 49 min   Charges:    PT Evaluation $PT Eval Low Complexity: 1 Low PT Treatments $Therapeutic Exercise: 8-22 mins $Therapeutic Activity: 8-22 mins PT General Charges $$ ACUTE PT VISIT: 1 Visit         Cloretta, PT, MPT Acute Rehabilitation Services Office: [phone] If a weekend: secure chat groups: [facility] PT, [facility] OT, [facility] SLP 06/23/2023   Dhriti Fales 06/23/2023, 8:07 PM

## 2023-06-23 NOTE — Interval H&P Note (Signed)
 History and Physical Interval Note:  06/23/2023 9:49 AM  Maria Malone  has presented today for surgery, with the diagnosis of Left hip osteoarthritis.  The various methods of treatment have been discussed with the patient and family. After consideration of risks, benefits and other options for treatment, the patient has consented to  Procedure(s): TOTAL HIP ARTHROPLASTY ANTERIOR APPROACH (Left) as a surgical intervention.  The patient's history has been reviewed, patient examined, no change in status, stable for surgery.  I have reviewed the patient's chart and labs.  Questions were answered to the patient's satisfaction.     [name]

## 2023-06-23 NOTE — Anesthesia Postprocedure Evaluation (Signed)
 Anesthesia Post Note  Patient: Maria Malone  Procedure(s) Performed: TOTAL HIP ARTHROPLASTY ANTERIOR APPROACH (Left: Hip)     Patient location during evaluation: PACU Anesthesia Type: MAC and Spinal Level of consciousness: awake and alert Pain management: pain level controlled Vital Signs Assessment: post-procedure vital signs reviewed and stable Respiratory status: spontaneous breathing, nonlabored ventilation, respiratory function stable and patient connected to nasal cannula oxygen Cardiovascular status: stable and blood pressure returned to baseline Postop Assessment: no apparent nausea or vomiting Anesthetic complications: no   No notable events documented.  Last Vitals:  Vitals:   06/23/23 1630 06/23/23 1645  BP: (!) 99/53 (!) 107/57  Pulse: 84 (!) 107  Resp: 20   Temp:    SpO2: 98% 100%    Last Pain:  Vitals:   06/23/23 1700  TempSrc:   PainSc: 0-No pain                 Cordella P Wania [place]

## 2023-06-23 NOTE — Discharge Instructions (Addendum)
 INSTRUCTIONS AFTER JOINT REPLACEMENT   Pain Regimen Instructions:  - Take tylenol  1000 mg every 6-8 hours around the clock  - Take the muscle relaxant around the clock  - Then take 5 mg (1 tablet) of oxycodone  every 4-6 hours as needed for severe pain  (can lower blood pressure) - Ice and elevate your leg as often as you can  Do not take over the counter pain medication until instructed by us .  If this is not controlling your pain, or you need refills - call our office at [phone] or send a message via the [employer] portal.   Take the stool softeners provided until you are having regular bowel movements, and then you may discontinue.    Remove items at home which could result in a fall. This includes throw rugs or furniture in walking pathways ICE to the affected joint every three hours while awake for 30 minutes at a time, for at least the first 3-5 days, and then as needed for pain and swelling.  Continue to use ice for pain and swelling. You may notice swelling that will progress down to the foot and ankle.  This is normal after surgery.  Elevate your leg when you are not up walking on it.   Continue to use the breathing machine you got in the hospital (incentive spirometer) which will help keep your temperature down.  It is common for your temperature to cycle up and down following surgery, especially at night when you are not up moving around and exerting yourself.  The breathing machine keeps your lungs expanded and your temperature down.   DIET:  As you were doing prior to hospitalization, we recommend a well-balanced diet.  DRESSING / WOUND CARE / SHOWERING  Keep the surgical dressing until follow up.  The dressing is water  proof, so you can shower without any extra covering.  IF THE DRESSING FALLS OFF or the wound gets wet inside, change the dressing with sterile gauze.  Please use good hand washing techniques before changing the dressing.  Do not use any lotions or creams on the  incision until instructed by your surgeon.    ACTIVITY  Increase activity slowly as tolerated, but follow the weight bearing instructions below.   No driving for 6 weeks or until further direction given by your physician.  You cannot drive while taking narcotics.  No lifting or carrying greater than 10 lbs. until further directed by your surgeon. Avoid periods of inactivity such as sitting longer than an hour when not asleep. This helps prevent blood clots.  You may return to work once you are authorized by your doctor.     WEIGHT BEARING   Weight bearing as tolerated with assist device (walker, cane, etc) as directed, use it as long as suggested by your surgeon or therapist, typically at least 4-6 weeks.   EXERCISES  Results after joint replacement surgery are often greatly improved when you follow the exercise, range of motion and muscle strengthening exercises prescribed by your doctor. Safety measures are also important to protect the joint from further injury. Any time any of these exercises cause you to have increased pain or swelling, decrease what you are doing until you are comfortable again and then slowly increase them. If you have problems or questions, call your caregiver or physical therapist for advice.   Rehabilitation is important following a joint replacement. After just a few days of immobilization, the muscles of the leg can become weakened and shrink (atrophy).  These exercises are designed to build up the tone and strength of the thigh and leg muscles and to improve motion. Often times heat used for twenty to thirty minutes before working out will loosen up your tissues and help with improving the range of motion but do not use heat for the first two weeks following surgery (sometimes heat can increase post-operative swelling).   These exercises can be done on a training (exercise) mat, on the floor, on a table or on a bed. Use whatever works the best and is most  comfortable for you.    Use music or television while you are exercising so that the exercises are a pleasant break in your day. This will make your life better with the exercises acting as a break in your routine that you can look forward to.   Perform all exercises about fifteen times, three times per day or as directed.  You should exercise both the operative leg and the other leg as well.  Exercises include:   Quad Sets - Tighten up the muscle on the front of the thigh (Quad) and hold for 5-10 seconds.   Straight Leg Raises - With your knee straight (if you were given a brace, keep it on), lift the leg to 60 degrees, hold for 3 seconds, and slowly lower the leg.  Perform this exercise against resistance later as your leg gets stronger.  Leg Slides: Lying on your back, slowly slide your foot toward your buttocks, bending your knee up off the floor (only go as far as is comfortable). Then slowly slide your foot back down until your leg is flat on the floor again.  Angel Wings: Lying on your back spread your legs to the side as far apart as you can without causing discomfort.  Hamstring Strength:  Lying on your back, push your heel against the floor with your leg straight by tightening up the muscles of your buttocks.  Repeat, but this time bend your knee to a comfortable angle, and push your heel against the floor.  You may put a pillow under the heel to make it more comfortable if necessary.   A rehabilitation program following joint replacement surgery can speed recovery and prevent re-injury in the future due to weakened muscles. Contact your doctor or a physical therapist for more information on knee rehabilitation.    CONSTIPATION  Constipation is defined medically as fewer than three stools per week and severe constipation as less than one stool per week.  Even if you have a regular bowel pattern at home, your normal regimen is likely to be disrupted due to multiple reasons following surgery.   Combination of anesthesia, postoperative narcotics, change in appetite and fluid intake all can affect your bowels.   YOU MUST use at least one of the following options; they are listed in order of increasing strength to get the job done.  They are all available over the counter, and you may need to use some, POSSIBLY even all of these options:    Drink plenty of fluids (prune juice may be helpful) and high fiber foods Colace 100 mg by mouth twice a day  Senokot for constipation as directed and as needed Dulcolax (bisacodyl ), take with full glass of water   Miralax  (polyethylene glycol) once or twice a day as needed.  If you have tried all these things and are unable to have a bowel movement in the first 3-4 days after surgery call either your surgeon or your primary  doctor.    If you experience loose stools or diarrhea, hold the medications until you stool forms back up.  If your symptoms do not get better within 1 week or if they get worse, check with your doctor.  If you experience the worst abdominal pain ever or develop nausea or vomiting, please contact the office immediately for further recommendations for treatment.   ITCHING:  If you experience itching with your medications, try taking only a single pain pill, or even half a pain pill at a time.  You can also use Benadryl  over the counter for itching or also to help with sleep.   TED HOSE STOCKINGS:  Use stockings on both legs until for at least 2 weeks or as directed by physician office. They may be removed at night for sleeping.  MEDICATIONS:  See your medication summary on the "After Visit Summary" that nursing will review with you.  You may have some home medications which will be placed on hold until you complete the course of blood thinner medication.  It is important for you to complete the blood thinner medication as prescribed.  PRECAUTIONS:  If you experience chest pain or shortness of breath - call 911 immediately for  transfer to the hospital emergency department.   If you develop a fever greater that 101 F, purulent drainage from wound, increased redness or drainage from wound, foul odor from the wound/dressing, or calf pain - CONTACT YOUR SURGEON.                                                   FOLLOW-UP APPOINTMENTS:  If you do not already have a post-op appointment, please call the office for an appointment to be seen by your surgeon.  Guidelines for how soon to be seen are listed in your "After Visit Summary", but are typically between 1-4 weeks after surgery.  OTHER INSTRUCTIONS:   Knee Replacement:  Do not place pillow under knee, focus on keeping the knee straight while resting. CPM instructions: 0-90 degrees, 2 hours in the morning, 2 hours in the afternoon, and 2 hours in the evening. Place foam block, curve side up under heel at all times except when in CPM or when walking.  DO NOT modify, tear, cut, or change the foam block in any way.  POST-OPERATIVE OPIOID TAPER INSTRUCTIONS: It is important to wean off of your opioid medication as soon as possible. If you do not need pain medication after your surgery it is ok to stop day one. Opioids include: Codeine, Hydrocodone(Norco, Vicodin), Oxycodone (Percocet, oxycontin ) and hydromorphone  amongst others.  Long term and even short term use of opiods can cause: Increased pain response Dependence Constipation Depression Respiratory depression And more.  Withdrawal symptoms can include Flu like symptoms Nausea, vomiting And more Techniques to manage these symptoms Hydrate well Eat regular healthy meals Stay active Use relaxation techniques(deep breathing, meditating, yoga) Do Not substitute Alcohol to help with tapering If you have been on opioids for less than two weeks and do not have pain than it is ok to stop all together.  Plan to wean off of opioids This plan should start within one week post op of your joint replacement. Maintain the  same interval or time between taking each dose and first decrease the dose.  Cut the total daily intake of opioids by one  tablet each day Next start to increase the time between doses. The last dose that should be eliminated is the evening dose.   MAKE SURE YOU:  Understand these instructions.  Get help right away if you are not doing well or get worse.    Thank you for letting us  be a part of your medical care team.  It is a privilege we respect greatly.  We hope these instructions will help you stay on track for a fast and full recovery!

## 2023-06-23 NOTE — Op Note (Signed)
 NAME:  Maria Malone                ACCOUNT NO.: [id number]      MEDICAL RECORD NO.: [id number]      FACILITY:  [facility]      PHYSICIAN:  [name]  DATE OF BIRTH:  Jul 16, 1955     DATE OF PROCEDURE:  06/23/2023                                 OPERATIVE REPORT         PREOPERATIVE DIAGNOSIS: Left  hip osteoarthritis.      POSTOPERATIVE DIAGNOSIS:  Left hip osteoarthritis.      PROCEDURE:  Left total hip replacement through an anterior approach   utilizing DePuy THR system, component size 54 mm pinnacle cup, a size 36+4 neutral   Altrex liner, a size 5 Hi Actis stem with a 36+1.5 delta ceramic   ball.      SURGEON:  Donnice JONETTA. Car, M.D.      ASSISTANT:  Rosina Calin, PA-C     ANESTHESIA:  Spinal.      SPECIMENS:  None.      COMPLICATIONS:  None.      BLOOD LOSS:  450 cc     DRAINS:  None.      INDICATION OF THE PROCEDURE:  Maria Malone is a 68 y.o. female who had   presented to office for evaluation of left hip pain.  Radiographs revealed   progressive degenerative changes with bone-on-bone   articulation of the  hip joint, including subchondral cystic changes and osteophytes.  The patient had painful limited range of   motion significantly affecting their overall quality of life and function.  The patient was failing to    respond to conservative measures including medications and/or injections and activity modification and at this point was ready   to proceed with more definitive measures.  Consent was obtained for   benefit of pain relief.  Specific risks of infection, DVT, component   failure, dislocation, neurovascular injury, and need for revision surgery were reviewed in the office.     PROCEDURE IN DETAIL:  The patient was brought to operative theater.   Once adequate anesthesia, preoperative antibiotics, 2 gm of Ancef , 1 gm of Tranexamic Acid , and 10 mg of Decadron  were administered, the patient was positioned supine on the  [employer] table.  Once the patient was safely positioned with adequate padding of boney prominences we predraped out the hip, and used fluoroscopy to confirm orientation of the pelvis.      The left hip was then prepped and draped from proximal iliac crest to   mid thigh with a shower curtain technique.      Time-out was performed identifying the patient, planned procedure, and the appropriate extremity.     An incision was then made 2 cm lateral to the   anterior superior iliac spine extending over the orientation of the   tensor fascia lata muscle and sharp dissection was carried down to the   fascia of the muscle.      The fascia was then incised.  The muscle belly was identified and swept   laterally and retractor placed along the superior neck.  Following   cauterization of the circumflex vessels and removing some pericapsular   fat, a second cobra retractor was placed on the inferior  neck.  A T-capsulotomy was made along the line of the   superior neck to the trochanteric fossa, then extended proximally and   distally.  Tag sutures were placed and the retractors were then placed   intracapsular.  We then identified the trochanteric fossa and   orientation of my neck cut and then made a neck osteotomy with the femur on traction.  The femoral   head was removed without difficulty or complication.  Traction was let   off and retractors were placed posterior and anterior around the   acetabulum.      The labrum and foveal tissue were debrided.  I began reaming with a 46 mm   reamer and reamed up to 53 mm reamer with good bony bed preparation and a 54 mm  cup was chosen.  The final 54 mm Pinnacle cup was then impacted under fluoroscopy to confirm the depth of penetration and orientation with respect to   Abduction and forward flexion.  A screw was placed into the ilium followed by the hole eliminator.  The final   36+4 neutral Altrex liner was impacted with good visualized rim fit.  The  cup was positioned anatomically within the acetabular portion of the pelvis.      At this point, the femur was rolled to 100 degrees.  Further capsule was   released off the inferior aspect of the femoral neck.  I then   released the superior capsule proximally.  With the leg in a neutral position the hook was placed laterally   along the femur under the vastus lateralis origin and elevated manually and then held in position using the hook attachment on the bed.  The leg was then extended and adducted with the leg rolled to 100   degrees of external rotation.  Retractors were placed along the medial calcar and posteriorly over the greater trochanter.  Once the proximal femur was fully   exposed, I used a box osteotome to set orientation.  I then began   broaching with the starting chili pepper broach and passed this by hand and then broached up to 5.  With the 5 broach in place I chose a high offset neck and did several trial reductions.  The offset was appropriate, leg lengths   appeared to be equal best matched with the +1.5 head ball trial confirmed radiographically.   Given these findings, I went ahead and dislocated the hip, repositioned all   retractors and positioned the right hip in the extended and abducted position.  The final 5 Hi Actis stem was   chosen and it was impacted down to the level of neck cut.  Based on this   and the trial reductions, a final 36+1.5 delta ceramic ball was chosen and   impacted onto a clean and dry trunnion, and the hip was reduced.  The   hip had been irrigated throughout the case again at this point.  I did   reapproximate the superior capsular leaflet to the anterior leaflet   using #1 Vicryl.  The fascia of the   tensor fascia lata muscle was then reapproximated using #1 Vicryl and #0 Stratafix sutures.  The   remaining wound was closed with 2-0 Vicryl and running 4-0 Monocryl.   The hip was cleaned, dried, and dressed sterilely using Dermabond and    Aquacel dressing.  The patient was then brought   to recovery room in stable condition tolerating the procedure well.    Rosina  Patti, PA-C was present for the entirety of the case involved from   preoperative positioning, perioperative retractor management, general   facilitation of the case, as well as primary wound closure as assistant.            Donnice CORDOBA Ernie, M.D.        06/23/2023 9:49 AM

## 2023-06-23 NOTE — Transfer of Care (Signed)
 Immediate Anesthesia Transfer of Care Note  Patient: Maria Malone  Procedure(s) Performed: TOTAL HIP ARTHROPLASTY ANTERIOR APPROACH (Left: Hip)  Patient Location: PACU  Anesthesia Type:Spinal  Level of Consciousness: awake, alert , and patient cooperative  Airway & Oxygen Therapy: Patient Spontanous Breathing and Patient connected to face mask oxygen  Post-op Assessment: Report given to RN and Post -op Vital signs reviewed and stable  Post vital signs: Reviewed and stable  Last Vitals:  Vitals Value Taken Time  BP 131/53   Temp    Pulse 84   Resp 14   SpO2 100     Last Pain:  Vitals:   06/23/23 0914  TempSrc: Oral         Complications: No notable events documented.

## 2023-06-23 NOTE — Anesthesia Preprocedure Evaluation (Signed)
 Anesthesia Evaluation  Patient identified by MRN, date of birth, ID band Patient awake    Reviewed: Allergy & Precautions, NPO status , Patient's Chart, lab work & pertinent test results  History of Anesthesia Complications (+) PONV and history of anesthetic complications  Airway Mallampati: II  TM Distance: >3 FB Neck ROM: Full    Dental no notable dental hx.    Pulmonary neg pulmonary ROS   Pulmonary exam normal        Cardiovascular negative cardio ROS  Rhythm:Regular Rate:Normal     Neuro/Psych negative neurological ROS  negative psych ROS   GI/Hepatic Neg liver ROS,GERD  Medicated,,  Endo/Other  diabetes, Type 2, Oral Hypoglycemic Agents    Renal/GU negative Renal ROS  negative genitourinary   Musculoskeletal  (+) Arthritis , Osteoarthritis,    Abdominal Normal abdominal exam  (+)   Peds  Hematology Lab Results      Component                Value               Date                      WBC                      6.5                 06/16/2023                HGB                      13.0                06/16/2023                HCT                      40.3                06/16/2023                MCV                      89.2                06/16/2023                PLT                      282                 06/16/2023              Anesthesia Other Findings   Reproductive/Obstetrics                             Anesthesia Physical Anesthesia Plan  ASA: 2  Anesthesia Plan: MAC and Spinal   Post-op Pain Management:    Induction: Intravenous  PONV Risk Score and Plan: 3 and Ondansetron , Dexamethasone , Propofol  infusion, Treatment may vary due to age or medical condition and Midazolam   Airway Management Planned: Simple Face Mask and Nasal Cannula  Additional Equipment: None  Intra-op Plan:   Post-operative Plan:   Informed Consent: I have reviewed the patients History  and Physical, chart, labs and discussed the  procedure including the risks, benefits and alternatives for the proposed anesthesia with the patient or authorized representative who has indicated his/her understanding and acceptance.     Dental advisory given  Plan Discussed with: CRNA  Anesthesia Plan Comments:        Anesthesia Quick Evaluation

## 2023-06-24 ENCOUNTER — Encounter (HOSPITAL_COMMUNITY): Payer: Self-pay | Admitting: Orthopedic Surgery

## 2023-06-24 DIAGNOSIS — M1612 Unilateral primary osteoarthritis, left hip: Secondary | ICD-10-CM | POA: Diagnosis not present

## 2023-06-24 LAB — BASIC METABOLIC PANEL
Anion gap: 10 (ref 5–15)
BUN: 19 mg/dL (ref 8–23)
CO2: 22 mmol/L (ref 22–32)
Calcium: 8.5 mg/dL — ABNORMAL LOW (ref 8.9–10.3)
Chloride: 101 mmol/L (ref 98–111)
Creatinine, Ser: 0.8 mg/dL (ref 0.44–1.00)
GFR, Estimated: >60 mL/min (ref >60)
Glucose, Bld: 225 mg/dL — ABNORMAL HIGH (ref 70–99)
Potassium: 4.4 mmol/L (ref 3.5–5.1)
Sodium: 133 mmol/L — ABNORMAL LOW (ref 135–145)

## 2023-06-24 LAB — CBC
HCT: 24.4 % — ABNORMAL LOW (ref 36.0–46.0)
Hemoglobin: 8.1 g/dL — ABNORMAL LOW (ref 12.0–15.0)
MCH: 29.3 pg (ref 26.0–34.0)
MCHC: 33.2 g/dL (ref 30.0–36.0)
MCV: 88.4 fL (ref 80.0–100.0)
Platelets: 307 10*3/uL (ref 150–400)
RBC: 2.76 MIL/uL — ABNORMAL LOW (ref 3.87–5.11)
RDW: 13.2 % (ref 11.5–15.5)
WBC: 12.2 10*3/uL — ABNORMAL HIGH (ref 4.0–10.5)
nRBC: 0 % (ref 0.0–0.2)

## 2023-06-24 MED ORDER — SODIUM CHLORIDE 0.9 % IV BOLUS
500.0000 mL | Freq: Once | INTRAVENOUS | Status: AC
  Administered 2023-06-24: 500 mL via INTRAVENOUS

## 2023-06-24 MED ORDER — TRANEXAMIC ACID 650 MG PO TABS
1950.0000 mg | ORAL_TABLET | Freq: Every day | ORAL | Status: DC
  Administered 2023-06-24: 1950 mg via ORAL
  Filled 2023-06-24: qty 3

## 2023-06-24 NOTE — Care Management Obs Status (Signed)
 MEDICARE OBSERVATION STATUS NOTIFICATION   Patient Details  Name: Maria Malone MRN: [id number] Date of Birth: Mar 29, 1956   Medicare Observation Status Notification Given:       Bari Leys, RN 06/24/2023, 9:45 AM

## 2023-06-24 NOTE — Progress Notes (Signed)
 Pt discharged and left with brother Autry Legions. Pt wheeled out to car by CNA. Pt originally wanted to have walker delivered to room that she could take home but didn't want to wait any longer for delivery. Pt stated she has a borrowed walker at home that she is able to use. Pt was advised to call Ortho office if she changed her mind again and wanted to try and order one through her own insurance. Pt verbalized understanding. Leomia Blake S Tatiyanna Lashley

## 2023-06-24 NOTE — TOC Transition Note (Signed)
 Transition of Care [facility]) - Discharge Note   Patient Details  Name: Maria Malone MRN: [id number] Date of Birth: 11-Aug-1955  [facility] CM/SW Contact:  Bari Leys, RN Phone Number: 06/24/2023, 10:48 AM   Clinical Narrative:   Met with pt at bedside to review follow up therapy and DME, pt confirmed HEP, no home DME needs.  No TOC needs     Final next level of care: Home/Self Care Barriers to Discharge: No Barriers Identified   Patient Goals and CMS Choice Patient states their goals for this hospitalization and ongoing recovery are:: return home          Discharge Placement                       Discharge Plan and Services Additional resources added to the After Visit Summary for                                       Social Drivers of Health (SDOH) Interventions SDOH Screenings   Food Insecurity: No Food Insecurity (06/23/2023)  Housing: Low Risk  (06/23/2023)  Transportation Needs: No Transportation Needs (06/23/2023)  Utilities: Not At Risk (06/23/2023)  Social Connections: Patient Declined (06/23/2023)  Tobacco Use: Low Risk  (06/23/2023)     Readmission Risk Interventions     No data to display

## 2023-06-24 NOTE — Progress Notes (Signed)
 Patient ambulating to the bathroom overnight. Asymptomatic, tolerated well.

## 2023-06-24 NOTE — Progress Notes (Signed)
 Physical Therapy Treatment Patient Details Name: Maria Malone MRN: [id number] DOB: Jun 10, 1955 Today's Date: 06/24/2023   History of Present Illness 68 yo s/p L DATHA 06/23/2023 with PMH of RTKA 01/2023 and manipulation 01/2023 .    PT Comments  Pt is POD # 1 and is progressing well.  Pt reports feeling much better today.  She has no pain and her BP was stable throughout session with no lightheadedness/dizziness.  Pt was able to ambulate 150' with RW and supervision and performed stairs similar to home set up.   Pt was provided written HEP and demonstrated good understanding with demo and teach back.  Pt demonstrates safe gait & transfers in order to return home from PT perspective once discharged by MD.  While in hospital, will continue to benefit from PT for skilled therapy to advance mobility and exercises.       If plan is discharge home, recommend the following: A little help with walking and/or transfers;A little help with bathing/dressing/bathroom;Assistance with cooking/housework;Assist for transportation;Help with stairs or ramp for entrance   Can travel by private vehicle        Equipment Recommendations  None recommended by PT    Recommendations for Other Services       Precautions / Restrictions Precautions Precautions: Fall Restrictions Weight Bearing Restrictions Per Provider Order: Yes LLE Weight Bearing Per Provider Order: Weight bearing as tolerated     Mobility  Bed Mobility Overal bed mobility: Needs Assistance Bed Mobility: Supine to Sit, Sit to Supine     Supine to sit: Supervision, HOB elevated Sit to supine: Supervision, HOB elevated        Transfers Overall transfer level: Needs assistance Equipment used: Rolling walker (2 wheels) Transfers: Sit to/from Stand Sit to Stand: Supervision           General transfer comment: Performed x 2 without difficulty; no dizziness/lightheadedness    Ambulation/Gait Ambulation/Gait assistance:  Supervision Gait Distance (Feet): 150 Feet Assistive device: Rolling walker (2 wheels) Gait Pattern/deviations: Step-through pattern, Decreased weight shift to left Gait velocity: decreased but functional     General Gait Details: Slight decrease in weight shift to L but steady gait with good RW proximity and self corrects posture when she starts to lean forward   Stairs Stairs: Yes Stairs assistance: Contact guard assist Stair Management: Step to pattern, Backwards, Forwards   General stair comments: Performed 1 platform step x 2 to simulate curb.  Had pt try up forward and backward. Tolerated both well. CGA for safety.  Educated on Cytogeneticist     Tilt Bed    Modified Rankin (Stroke Patients Only)       Balance Overall balance assessment: Needs assistance Sitting-balance support: No upper extremity supported Sitting balance-Leahy Scale: Normal     Standing balance support: Bilateral upper extremity supported, No upper extremity supported Standing balance-Leahy Scale: Fair Standing balance comment: RW to ambulate; could stand without UE support                            Cognition Arousal: Alert Behavior During Therapy: WFL for tasks assessed/performed Overall Cognitive Status: Within Functional Limits for tasks assessed                                          Exercises Total Joint Exercises Ankle Circles/Pumps: AROM,  Both, 10 reps Quad Sets: AROM, Both, 10 reps Heel Slides: AAROM, Left, 10 reps, Supine Hip ABduction/ADduction: AAROM, Left, Supine, 10 reps Long Arc Quad: AROM, Left, 10 reps, Seated Other Exercises Other Exercises: Educated on AAROM with gait belt as needed Other Exercises: Standing holding counter with supervision AROM L LE 10 reps: marching, hip abd, hip ext, hamstring curl    General Comments General comments (skin integrity, edema, etc.): BP supine (just prior to session ) 101/51; sitting  126/60, standing 118/50.  Pt had no episodes of dizziness or lightheadedness. Did educate on orthostatic hypotension management with slow transitions, AROM in each position prior to progressing, and return to sitting or supine if lightheaded. Also discussed staying hydrated and eating well (particularly with pain meds)   Educated on safe ice use, no pivots, car transfers. Also, encouraged walking every 1-2 hours during day. Educated on HEP with focus on mobility the first weeks. Discussed doing exercises within pain control and if pain increasing could decreased ROM, reps, and stop exercises as needed. Encouraged to perform  ankle pumps frequently for blood flow       Pertinent Vitals/Pain Pain Assessment Pain Assessment: No/denies pain    Home Living                          Prior Function            PT Goals (current goals can now be found in the care plan section) Progress towards PT goals: Progressing toward goals    Frequency    7X/week      PT Plan      Co-evaluation              AM-PAC PT "6 Clicks" Mobility   Outcome Measure  Help needed turning from your back to your side while in a flat bed without using bedrails?: None Help needed moving from lying on your back to sitting on the side of a flat bed without using bedrails?: A Little Help needed moving to and from a bed to a chair (including a wheelchair)?: A Little Help needed standing up from a chair using your arms (e.g., wheelchair or bedside chair)?: A Little Help needed to walk in hospital room?: A Little Help needed climbing 3-5 steps with a railing? : A Little 6 Click Score: 19    End of Session Equipment Utilized During Treatment: Gait belt Activity Tolerance: Patient tolerated treatment well Patient left: with call bell/phone within reach;with family/visitor present;in chair Nurse Communication: Mobility status PT Visit Diagnosis: Difficulty in walking, not elsewhere classified (R26.2)  (cleared therapy, BP stable)     Time: 1610-9604 PT Time Calculation (min) (ACUTE ONLY): 31 min  Charges:    $Gait Training: 8-22 mins $Therapeutic Exercise: 8-22 mins PT General Charges $$ ACUTE PT VISIT: 1 Visit                     Cyd Dowse, PT Acute Rehab Services [facility] [phone]    Carolynn Citrin 06/24/2023, 11:34 AM

## 2023-06-24 NOTE — Plan of Care (Signed)
  Problem: Health Behavior/Discharge Planning: Goal: Ability to manage health-related needs will improve Outcome: Adequate for Discharge   Problem: Activity: Goal: Risk for activity intolerance will decrease Outcome: Adequate for Discharge   Problem: Pain Management: Goal: General experience of comfort will improve Outcome: Adequate for Discharge   Problem: Safety: Goal: Ability to remain free from injury will improve Outcome: Adequate for Discharge   Problem: Skin Integrity: Goal: Risk for impaired skin integrity will decrease Outcome: Adequate for Discharge   Problem: Activity: Goal: Ability to tolerate increased activity will improve Outcome: Adequate for Discharge   Problem: Clinical Measurements: Goal: Postoperative complications will be avoided or minimized Outcome: Adequate for Discharge   Problem: Pain Management: Goal: Pain level will decrease with appropriate interventions Outcome: Adequate for Discharge   Problem: Skin Integrity: Goal: Will show signs of wound healing Outcome: Adequate for Discharge   Problem: Health Behavior/Discharge Planning: Goal: Ability to manage health-related needs will improve Outcome: Adequate for Discharge   Problem: Activity: Goal: Risk for activity intolerance will decrease Outcome: Adequate for Discharge   Problem: Pain Management: Goal: General experience of comfort will improve Outcome: Adequate for Discharge   Problem: Skin Integrity: Goal: Risk for impaired skin integrity will decrease Outcome: Adequate for Discharge   Problem: Activity: Goal: Ability to avoid complications of mobility impairment will improve Outcome: Adequate for Discharge Goal: Ability to tolerate increased activity will improve Outcome: Adequate for Discharge   Problem: Clinical Measurements: Goal: Postoperative complications will be avoided or minimized Outcome: Adequate for Discharge   Problem: Pain Management: Goal: Pain level will  decrease with appropriate interventions Outcome: Adequate for Discharge   Problem: Skin Integrity: Goal: Will show signs of wound healing Outcome: Adequate for Discharge

## 2023-06-24 NOTE — Progress Notes (Addendum)
   Subjective: 1 Day Post-Op Procedure(s) (LRB): TOTAL HIP ARTHROPLASTY ANTERIOR APPROACH (Left) Patient reports pain as 3 on 0-10 scale and mild.   Patient seen in rounds for Dr. Bernard Brick. Patient is resting in bed on exam this morning. She had orthostatic hypotension yesterday resulting in her staying overnight. She recalls having this with her knee replacement as well and was glad she stayed. She got up to pee overnight and did not feel pre-syncopal or dizzy.  We will start therapy today.   Objective: Vital signs in last 24 hours: Temp:  [97.5 F (36.4 C)-98.5 F (36.9 C)] 98.5 F (36.9 C) (01/15 0611) Pulse Rate:  [59-121] 78 (01/15 0611) Resp:  [10-22] 17 (01/15 0611) BP: (55-145)/(38-75) 102/54 (01/15 0611) SpO2:  [94 %-100 %] 100 % (01/15 0611) Weight:  [70.8 kg] 70.8 kg (01/14 0941)  Intake/Output from previous day:  Intake/Output Summary (Last 24 hours) at 06/24/2023 0822 Last data filed at 06/23/2023 1800 Gross per 24 hour  Intake 2722.54 ml  Output 1150 ml  Net 1572.54 ml     Intake/Output this shift: No intake/output data recorded.  Labs: Recent Labs    06/24/23 0331  HGB 8.1*   Recent Labs    06/24/23 0331  WBC 12.2*  RBC 2.76*  HCT 24.4*  PLT 307   Recent Labs    06/24/23 0331  NA 133*  K 4.4  CL 101  CO2 22  BUN 19  CREATININE 0.80  GLUCOSE 225*  CALCIUM 8.5*   No results for input(s): "LABPT", "INR" in the last 72 hours.  Exam: General - Patient is Alert and Oriented Extremity - Neurologically intact Sensation intact distally Intact pulses distally Dorsiflexion/Plantar flexion intact Dressing - dressing C/D/I Motor Function - intact, moving foot and toes well on exam.   Past Medical History:  Diagnosis Date   Arthritis    Diabetes mellitus without complication (HCC)    GERD (gastroesophageal reflux disease)    High triglycerides    PONV (postoperative nausea and vomiting)     Assessment/Plan: 1 Day Post-Op Procedure(s)  (LRB): TOTAL HIP ARTHROPLASTY ANTERIOR APPROACH (Left) Principal Problem:   S/P total hip arthroplasty Active Problems:   S/P total left hip arthroplasty  Estimated body mass index is 25.96 kg/m as calculated from the following:   Height as of this encounter: 5\' 5"  (1.651 m).   Weight as of this encounter: 70.8 kg. Advance diet Up with therapy  DVT Prophylaxis - Aspirin  Weight bearing as tolerated.  ABLA: Hgb 13>8. Slightly hypotensive but stable overall.  Will give fluid bolus today, and patient knows to increase oral fluids Will give 1 dose oral TXA today  We discussed limiting oxycodone  at home as this can also lower blood pressure.   Up with PT, home if she is doing well and asymptomatic.   Follow up in the office in 2 weeks.   All meds sent yesterday with anticipation of d/c home.   Kim Pen, PA-C Orthopedic Surgery [phone] 06/24/2023, 8:22 AM

## 2023-06-28 NOTE — Discharge Summary (Signed)
 Patient ID: Maria Malone MRN: [id number] DOB/AGE: [birth year] 68 y.o.  Admit date: 06/23/2023 Discharge date: 06/24/2023  Admission Diagnoses:  Left hip osteoarthritis  Discharge Diagnoses:  Principal Problem:   S/P total hip arthroplasty Active Problems:   S/P total left hip arthroplasty   Past Medical History:  Diagnosis Date   Arthritis    Diabetes mellitus without complication (HCC)    GERD (gastroesophageal reflux disease)    High triglycerides    PONV (postoperative nausea and vomiting)     Surgeries: Procedure(s): TOTAL HIP ARTHROPLASTY ANTERIOR APPROACH on 06/23/2023   Consultants:   Discharged Condition: Improved  Hospital Course: Maria Malone is an 68 y.o. female who was admitted 06/23/2023 for operative treatment ofS/P total hip arthroplasty. Patient has severe unremitting pain that affects sleep, daily activities, and work/hobbies. After pre-op clearance the patient was taken to the operating room on 06/23/2023 and underwent  Procedure(s): TOTAL HIP ARTHROPLASTY ANTERIOR APPROACH.    Patient was given perioperative antibiotics:  Anti-infectives (From admission, onward)    Start     Dose/Rate Route Frequency Ordered Stop   06/23/23 1700  ceFAZolin (ANCEF) IVPB 2g/100 mL premix        2 g 200 mL/hr over 30 Minutes Intravenous Every 6 hours 06/23/23 1254 06/24/23 0740   06/23/23 0900  ceFAZolin (ANCEF) IVPB 2g/100 mL premix        2 g 200 mL/hr over 30 Minutes Intravenous On call to O.R. 06/23/23 0859 06/23/23 1112        Patient was given sequential compression devices, early ambulation, and chemoprophylaxis to prevent DVT. Patient worked with PT and was meeting their goals regarding safe ambulation and transfers.  Patient benefited maximally from hospital stay and there were no complications.    Recent vital signs: No data found.   Recent laboratory studies: No results for input(s): "WBC", "HGB", "HCT", "PLT", "NA", "K", "CL", "CO2", "BUN",  "CREATININE", "GLUCOSE", "INR", "CALCIUM" in the last 72 hours.  Invalid input(s): "PT", "2"   Discharge Medications:   Allergies as of 06/24/2023       Reactions   Actos [pioglitazone]    MUSCLE CRAMPS   Crestor [rosuvastatin Calcium]    SEVERE JOINT PAIN        Medication List     TAKE these medications    Accu-Chek Guide w/Device Kit USE 1 EACH DAILY   CALCIUM WITH D3 PO Take 1 tablet by mouth in the morning.   carboxymethylcellulose 0.5 % Soln Commonly known as: REFRESH PLUS Place 1 drop into both eyes See admin instructions. Instill 1 drop into both eyes (scheduled) every morning & may instill 1-2 drops into both eyes as needed for dry/irritated eyes.   cholecalciferol 25 MCG (1000 UNIT) tablet Commonly known as: VITAMIN D3 Take 1,000 Units by mouth in the morning.   esomeprazole 20 MG capsule Commonly known as: NEXIUM Take 20 mg by mouth daily before breakfast.   Fish Oil 1200 MG Caps Take 2,400 mg by mouth in the morning.   metFORMIN 500 MG 24 hr tablet Commonly known as: GLUCOPHAGE-XR Take 1,000 mg by mouth 2 (two) times daily with a meal.   Mounjaro 5 MG/0.5ML Pen Generic drug: tirzepatide Inject 5 mg into the skin every Sunday.   multivitamin with minerals Tabs tablet Take 1 tablet by mouth in the morning. One A Day Women's 50+               Discharge Care Instructions  (From admission, onward)  Start     Ordered   06/24/23 0000  Change dressing       Comments: Maintain surgical dressing until follow up in the clinic. If the edges start to pull up, may reinforce with tape. If the dressing is no longer working, may remove and cover with gauze and tape, but must keep the area dry and clean.  Call with any questions or concerns.   06/24/23 0825   06/23/23 0000  Change dressing       Comments: Maintain surgical dressing until follow up in the clinic. If the edges start to pull up, may reinforce with tape. If the dressing is no  longer working, may remove and cover with gauze and tape, but must keep the area dry and clean.  Call with any questions or concerns.   06/23/23 1239   06/23/23 0000  Change dressing       Comments: Maintain surgical dressing until follow up in the clinic. If the edges start to pull up, may reinforce with tape. If the dressing is no longer working, may remove and cover with gauze and tape, but must keep the area dry and clean.  Call with any questions or concerns.   06/23/23 1239            Diagnostic Studies: DG Pelvis Portable Result Date: 06/23/2023 CLINICAL DATA:  Status post left hip arthroplasty. EXAM: PORTABLE PELVIS 1-2 VIEWS COMPARISON:  None Available. FINDINGS: Left hip arthroplasty in expected alignment. No periprosthetic lucency or fracture. Recent postsurgical change includes air and edema in the soft tissues. IMPRESSION: Left hip arthroplasty without immediate postoperative complication. Electronically Signed   By: Narda Rutherford M.D.   On: 06/23/2023 13:45   DG HIP UNILAT WITH PELVIS 1V LEFT Result Date: 06/23/2023 CLINICAL DATA:  Elective surgery. EXAM: DG HIP (WITH OR WITHOUT PELVIS) 1V*L* COMPARISON:  None Available. FINDINGS: Ten fluoroscopic spot views of the pelvis and left hip obtained in the operating room. Sequential images during hip arthroplasty. Fluoroscopy time 6 seconds. Dose are 0.66 mGy. IMPRESSION: Intraoperative fluoroscopy during left hip arthroplasty. Electronically Signed   By: Narda Rutherford M.D.   On: 06/23/2023 12:47   DG C-Arm 1-60 Min-No Report Result Date: 06/23/2023 Fluoroscopy was utilized by the requesting physician.  No radiographic interpretation.   DG C-Arm 1-60 Min-No Report Result Date: 06/23/2023 Fluoroscopy was utilized by the requesting physician.  No radiographic interpretation.    Disposition: Discharge disposition: 01-Home or Self Care       Discharge Instructions     Call MD / Call 911   Complete by: As directed    If  you experience chest pain or shortness of breath, CALL 911 and be transported to the hospital emergency room.  If you develope a fever above 101 F, pus (white drainage) or increased drainage or redness at the wound, or calf pain, call your surgeon's office.   Call MD / Call 911   Complete by: As directed    If you experience chest pain or shortness of breath, CALL 911 and be transported to the hospital emergency room.  If you develope a fever above 101 F, pus (white drainage) or increased drainage or redness at the wound, or calf pain, call your surgeon's office.   Change dressing   Complete by: As directed    Maintain surgical dressing until follow up in the clinic. If the edges start to pull up, may reinforce with tape. If the dressing is no longer working, may remove  and cover with gauze and tape, but must keep the area dry and clean.  Call with any questions or concerns.   Change dressing   Complete by: As directed    Maintain surgical dressing until follow up in the clinic. If the edges start to pull up, may reinforce with tape. If the dressing is no longer working, may remove and cover with gauze and tape, but must keep the area dry and clean.  Call with any questions or concerns.   Change dressing   Complete by: As directed    Maintain surgical dressing until follow up in the clinic. If the edges start to pull up, may reinforce with tape. If the dressing is no longer working, may remove and cover with gauze and tape, but must keep the area dry and clean.  Call with any questions or concerns.   Constipation Prevention   Complete by: As directed    Drink plenty of fluids.  Prune juice may be helpful.  You may use a stool softener, such as Colace (over the counter) 100 mg twice a day.  Use MiraLax (over the counter) for constipation as needed.   Constipation Prevention   Complete by: As directed    Drink plenty of fluids.  Prune juice may be helpful.  You may use a stool softener, such as  Colace (over the counter) 100 mg twice a day.  Use MiraLax (over the counter) for constipation as needed.   Diet - low sodium heart healthy   Complete by: As directed    Diet - low sodium heart healthy   Complete by: As directed    Increase activity slowly as tolerated   Complete by: As directed    Weight bearing as tolerated with assist device (walker, cane, etc) as directed, use it as long as suggested by your surgeon or therapist, typically at least 4-6 weeks.   Increase activity slowly as tolerated   Complete by: As directed    Weight bearing as tolerated with assist device (walker, cane, etc) as directed, use it as long as suggested by your surgeon or therapist, typically at least 4-6 weeks.   Post-operative opioid taper instructions:   Complete by: As directed    POST-OPERATIVE OPIOID TAPER INSTRUCTIONS: It is important to wean off of your opioid medication as soon as possible. If you do not need pain medication after your surgery it is ok to stop day one. Opioids include: Codeine, Hydrocodone(Norco, Vicodin), Oxycodone(Percocet, oxycontin) and hydromorphone amongst others.  Long term and even short term use of opiods can cause: Increased pain response Dependence Constipation Depression Respiratory depression And more.  Withdrawal symptoms can include Flu like symptoms Nausea, vomiting And more Techniques to manage these symptoms Hydrate well Eat regular healthy meals Stay active Use relaxation techniques(deep breathing, meditating, yoga) Do Not substitute Alcohol to help with tapering If you have been on opioids for less than two weeks and do not have pain than it is ok to stop all together.  Plan to wean off of opioids This plan should start within one week post op of your joint replacement. Maintain the same interval or time between taking each dose and first decrease the dose.  Cut the total daily intake of opioids by one tablet each day Next start to increase the  time between doses. The last dose that should be eliminated is the evening dose.      Post-operative opioid taper instructions:   Complete by: As directed  POST-OPERATIVE OPIOID TAPER INSTRUCTIONS: It is important to wean off of your opioid medication as soon as possible. If you do not need pain medication after your surgery it is ok to stop day one. Opioids include: Codeine, Hydrocodone(Norco, Vicodin), Oxycodone(Percocet, oxycontin) and hydromorphone amongst others.  Long term and even short term use of opiods can cause: Increased pain response Dependence Constipation Depression Respiratory depression And more.  Withdrawal symptoms can include Flu like symptoms Nausea, vomiting And more Techniques to manage these symptoms Hydrate well Eat regular healthy meals Stay active Use relaxation techniques(deep breathing, meditating, yoga) Do Not substitute Alcohol to help with tapering If you have been on opioids for less than two weeks and do not have pain than it is ok to stop all together.  Plan to wean off of opioids This plan should start within one week post op of your joint replacement. Maintain the same interval or time between taking each dose and first decrease the dose.  Cut the total daily intake of opioids by one tablet each day Next start to increase the time between doses. The last dose that should be eliminated is the evening dose.      TED hose   Complete by: As directed    Use stockings (TED hose) for 2 weeks on both leg(s).  You may remove them at night for sleeping.   TED hose   Complete by: As directed    Use stockings (TED hose) for 2 weeks on both leg(s).  You may remove them at night for sleeping.   TED hose   Complete by: As directed    Use stockings (TED hose) for 2 weeks on both leg(s).  You may remove them at night for sleeping.        Follow-up Information     Durene Romans, MD. Schedule an appointment as soon as possible for a visit in 2  week(s).   Specialty: Orthopedic Surgery Contact information: [address] [place] [address] [phone]         [facility] Follow up.   Why: Dispensing optician information: [address] [place], [place] [address]   phone: [phone]                 Signed: Cassandria Anger 06/28/2023, 7:33 AM

## 2023-08-24 ENCOUNTER — Encounter (INDEPENDENT_AMBULATORY_CARE_PROVIDER_SITE_OTHER): Payer: Medicare Other | Admitting: Ophthalmology

## 2023-08-24 DIAGNOSIS — H35341 Macular cyst, hole, or pseudohole, right eye: Secondary | ICD-10-CM

## 2023-08-24 DIAGNOSIS — H33302 Unspecified retinal break, left eye: Secondary | ICD-10-CM | POA: Diagnosis not present

## 2023-08-24 DIAGNOSIS — H338 Other retinal detachments: Secondary | ICD-10-CM | POA: Diagnosis not present

## 2023-08-24 DIAGNOSIS — H43813 Vitreous degeneration, bilateral: Secondary | ICD-10-CM | POA: Diagnosis not present

## 2023-12-19 LAB — COLOGUARD: COLOGUARD: NEGATIVE

## 2024-03-01 ENCOUNTER — Other Ambulatory Visit: Payer: Self-pay | Admitting: Family Medicine

## 2024-03-01 DIAGNOSIS — Z1231 Encounter for screening mammogram for malignant neoplasm of breast: Secondary | ICD-10-CM

## 2024-04-11 ENCOUNTER — Ambulatory Visit
Admission: RE | Admit: 2024-04-11 | Discharge: 2024-04-11 | Disposition: A | Discharge status: Home / Self Care | Source: Ambulatory Visit | Attending: Family Medicine | Admitting: Family Medicine

## 2024-04-11 DIAGNOSIS — Z1231 Encounter for screening mammogram for malignant neoplasm of breast: Secondary | ICD-10-CM

## 2024-08-22 ENCOUNTER — Encounter (INDEPENDENT_AMBULATORY_CARE_PROVIDER_SITE_OTHER): Admitting: Ophthalmology
